# Patient Record
Sex: Female | Born: 1965
Health system: Southern US, Community
[De-identification: ages and names within clinical notes are randomized; demographics above are authoritative.]

## PROBLEM LIST (undated history)

## (undated) DIAGNOSIS — Z8481 Family history of carrier of genetic disease: Secondary | ICD-10-CM

## (undated) DIAGNOSIS — F32A Depression, unspecified: Secondary | ICD-10-CM

## (undated) DIAGNOSIS — Z803 Family history of malignant neoplasm of breast: Secondary | ICD-10-CM

## (undated) DIAGNOSIS — K2210 Ulcer of esophagus without bleeding: Secondary | ICD-10-CM

## (undated) DIAGNOSIS — N390 Urinary tract infection, site not specified: Secondary | ICD-10-CM

## (undated) DIAGNOSIS — F329 Major depressive disorder, single episode, unspecified: Secondary | ICD-10-CM

## (undated) DIAGNOSIS — F419 Anxiety disorder, unspecified: Secondary | ICD-10-CM

## (undated) DIAGNOSIS — K219 Gastro-esophageal reflux disease without esophagitis: Secondary | ICD-10-CM

## (undated) DIAGNOSIS — J302 Other seasonal allergic rhinitis: Secondary | ICD-10-CM

## (undated) DIAGNOSIS — T7840XA Allergy, unspecified, initial encounter: Secondary | ICD-10-CM

## (undated) HISTORY — DX: Gastro-esophageal reflux disease without esophagitis: K21.9

## (undated) HISTORY — DX: Allergy, unspecified, initial encounter: T78.40XA

## (undated) HISTORY — DX: Family history of carrier of genetic disease: Z84.81

## (undated) HISTORY — DX: Depression, unspecified: F32.A

## (undated) HISTORY — PX: APPENDECTOMY: SHX54

## (undated) HISTORY — DX: Anxiety disorder, unspecified: F41.9

## (undated) HISTORY — PX: ESOPHAGOGASTRODUODENOSCOPY: SHX1529

## (undated) HISTORY — DX: Family history of malignant neoplasm of breast: Z80.3

---
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
## 2010-05-06 ENCOUNTER — Ambulatory Visit (INDEPENDENT_AMBULATORY_CARE_PROVIDER_SITE_OTHER): Payer: Self-pay | Admitting: Internal Medicine

## 2014-04-06 ENCOUNTER — Encounter: Payer: Self-pay | Admitting: *Deleted

## 2014-05-02 ENCOUNTER — Encounter: Payer: Self-pay | Admitting: Family Medicine

## 2014-05-02 ENCOUNTER — Ambulatory Visit (INDEPENDENT_AMBULATORY_CARE_PROVIDER_SITE_OTHER): Payer: 59 | Admitting: Family Medicine

## 2014-05-02 VITALS — BP 110/68 | HR 72 | Temp 98.6°F | Resp 16 | Ht 60.5 in | Wt 138.0 lb

## 2014-05-02 DIAGNOSIS — Z833 Family history of diabetes mellitus: Secondary | ICD-10-CM

## 2014-05-02 DIAGNOSIS — F988 Other specified behavioral and emotional disorders with onset usually occurring in childhood and adolescence: Secondary | ICD-10-CM | POA: Insufficient documentation

## 2014-05-02 DIAGNOSIS — K219 Gastro-esophageal reflux disease without esophagitis: Secondary | ICD-10-CM | POA: Diagnosis not present

## 2014-05-02 DIAGNOSIS — Z0000 Encounter for general adult medical examination without abnormal findings: Secondary | ICD-10-CM

## 2014-05-02 DIAGNOSIS — Z1211 Encounter for screening for malignant neoplasm of colon: Secondary | ICD-10-CM | POA: Diagnosis not present

## 2014-05-02 DIAGNOSIS — F411 Generalized anxiety disorder: Secondary | ICD-10-CM | POA: Diagnosis not present

## 2014-05-02 DIAGNOSIS — F909 Attention-deficit hyperactivity disorder, unspecified type: Secondary | ICD-10-CM

## 2014-05-02 DIAGNOSIS — J302 Other seasonal allergic rhinitis: Secondary | ICD-10-CM | POA: Diagnosis not present

## 2014-05-02 DIAGNOSIS — F418 Other specified anxiety disorders: Secondary | ICD-10-CM | POA: Insufficient documentation

## 2014-05-02 LAB — LIPID PANEL
CHOL/HDL RATIO: 3.3 ratio
CHOLESTEROL: 167 mg/dL (ref 0–200)
HDL: 50 mg/dL (ref >=46)
LDL Cholesterol: 96 mg/dL (ref 0–99)
Triglycerides: 105 mg/dL (ref <150)
VLDL: 21 mg/dL (ref 0–40)

## 2014-05-02 LAB — CBC WITH DIFFERENTIAL/PLATELET
BASOS PCT: 0 % (ref 0–1)
Basophils Absolute: 0 10*3/uL (ref 0.0–0.1)
EOS ABS: 0.1 10*3/uL (ref 0.0–0.7)
Eosinophils Relative: 2 % (ref 0–5)
HCT: 35.2 % — ABNORMAL LOW (ref 36.0–46.0)
Hemoglobin: 11.5 g/dL — ABNORMAL LOW (ref 12.0–15.0)
Lymphocytes Relative: 22 % (ref 12–46)
Lymphs Abs: 1.4 10*3/uL (ref 0.7–4.0)
MCH: 28.3 pg (ref 26.0–34.0)
MCHC: 32.7 g/dL (ref 30.0–36.0)
MCV: 86.5 fL (ref 78.0–100.0)
MPV: 9.7 fL (ref 8.6–12.4)
Monocytes Absolute: 0.6 10*3/uL (ref 0.1–1.0)
Monocytes Relative: 9 % (ref 3–12)
NEUTROS PCT: 67 % (ref 43–77)
Neutro Abs: 4.4 10*3/uL (ref 1.7–7.7)
Platelets: 324 10*3/uL (ref 150–400)
RBC: 4.07 MIL/uL (ref 3.87–5.11)
RDW: 16 % — ABNORMAL HIGH (ref 11.5–15.5)
WBC: 6.5 10*3/uL (ref 4.0–10.5)

## 2014-05-02 LAB — COMPREHENSIVE METABOLIC PANEL
ALK PHOS: 49 U/L (ref 39–117)
ALT: 12 U/L (ref 0–35)
AST: 16 U/L (ref 0–37)
Albumin: 4.4 g/dL (ref 3.5–5.2)
BILIRUBIN TOTAL: 0.7 mg/dL (ref 0.2–1.2)
BUN: 11 mg/dL (ref 6–23)
CO2: 24 mEq/L (ref 19–32)
CREATININE: 0.68 mg/dL (ref 0.50–1.10)
Calcium: 9.2 mg/dL (ref 8.4–10.5)
Chloride: 106 mEq/L (ref 96–112)
Glucose, Bld: 86 mg/dL (ref 70–99)
Potassium: 4.3 mEq/L (ref 3.5–5.3)
SODIUM: 140 meq/L (ref 135–145)
Total Protein: 6.8 g/dL (ref 6.0–8.3)

## 2014-05-02 LAB — HEMOGLOBIN A1C
Hgb A1c MFr Bld: 5.5 % (ref <5.7)
MEAN PLASMA GLUCOSE: 111 mg/dL (ref <117)

## 2014-05-02 LAB — TSH: TSH: 1.587 u[IU]/mL (ref 0.350–4.500)

## 2014-05-02 MED ORDER — CITALOPRAM HYDROBROMIDE 20 MG PO TABS: 20.0000 mg | ORAL_TABLET | Freq: Every day | ORAL | Status: DC

## 2014-05-02 MED ORDER — METHYLPHENIDATE HCL ER (OSM) 18 MG PO TBCR: 18.0000 mg | EXTENDED_RELEASE_TABLET | Freq: Every day | ORAL | Status: DC

## 2014-05-02 MED ORDER — PANTOPRAZOLE SODIUM 20 MG PO TBEC: 20.0000 mg | DELAYED_RELEASE_TABLET | Freq: Every day | ORAL | Status: DC

## 2014-05-02 MED ORDER — MOMETASONE FUROATE 50 MCG/ACT NA SUSP: 2.0000 | Freq: Every day | NASAL | Status: DC

## 2014-05-02 NOTE — Assessment & Plan Note (Signed)
Continue protonix  

## 2014-05-02 NOTE — Patient Instructions (Addendum)
Release of records- [facility]- Dr. Barrie Dunker [facility] Release of records- Dr. Woody Seller [facility]) GYN: [facility] ( Dr. Julien Girt)          Or [facility] GYN Referral for Colonoscopy- Dr. Laural Golden Try the Concerta once a day in the morning  We will call with lab results F/U 4 weeks

## 2014-05-02 NOTE — Assessment & Plan Note (Addendum)
Trial of Concerta, will start with low dose and titrate up

## 2014-05-02 NOTE — Assessment & Plan Note (Signed)
Continue nasonex

## 2014-05-02 NOTE — Progress Notes (Signed)
Patient ID: Shelley Macias, female   DOB: May 12, 1965, 49 y.o.   MRN: [id number]   Subjective:    Patient ID: Shelley Macias, female    DOB: 1965-12-17, 49 y.o.   MRN: [id number]  Patient presents for New Patient CPE- no PAP  patient to establish care. Her previous PCP was Dr. Woody Seller , [facility] internal medicine  Previous GYN was [facility] Dr. Barrie Dunker Patient has history of generalized anxiety she has been on Celexa for some time and does well to medication. 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 She is a family history of ADD and ADHD. She's had symptoms of ADD and inattentiveness that had never wanted to go on medication until now. She finds that it takes her extra hours to do her charting and often she'll not leave work until about 8:00 PM even though she goes in quite early in the morning. She feels scatterbrained and unorganized. She cannot focus on one thing at a time.  He is due for mammogram she has history of hypodensities in the breast therefore needs 3-D tomography she's also due for colonoscopy she recently had some constipation but that was due to some changes in her diet.   History and medications reviewed due for fasting labs today she has a family history of diabetes mellitus she will like to be checked for this.  She still having regular menstrual cycles she was seen by her GYN about 2 years ago she needs to establish with a new one within her network   Review Of Systems:  GEN- denies fatigue, fever, weight loss,weakness, recent illness HEENT- denies eye drainage, change in vision, nasal discharge, CVS- denies chest pain, palpitations RESP- denies SOB, cough, wheeze ABD- denies N/V, change in stools, abd pain GU- denies dysuria, hematuria, dribbling, incontinence MSK- denies joint pain, muscle aches, injury Neuro- denies headache, dizziness, syncope, seizure  activity       Objective:    BP 110/68 mmHg  Pulse 72  Temp(Src) 98.6 F (37 C) (Oral)  Resp 16  Ht 5' 0.5" (1.537 m)  Wt 138 lb (62.596 kg)  BMI 26.50 kg/m2  LMP 04/25/2014 (Approximate) GEN- NAD, alert and oriented x3 HEENT- PERRL, EOMI, non injected sclera, pink conjunctiva, MMM, oropharynx clear, TM clear bilat Neck- Supple, no thyromegaly CVS- RRR, no murmur RESP-CTAB ABD-NABS,soft,NT,ND Psych- normal affect and mood EXT- No edema Pulses- Radial, DP- 2+        Assessment & Plan:      Problem List Items Addressed This Visit      Unprioritized   Seasonal allergies    Continue nasonex      GERD (gastroesophageal reflux disease)    Continue protonix      Relevant Medications   pantoprazole (PROTONIX) EC tablet   GAD (generalized anxiety disorder)   ADD (attention deficit disorder)    Continue celexa       Other Visit Diagnoses    Routine general medical examination at a health care facility    -  Primary    Pt to establish witH GYN, Mammogram to be scheduled- needs 3D mammo, colon cancer screening    Relevant Orders    CBC with Differential/Platelet    Comprehensive metabolic panel    Lipid panel    TSH    Vitamin D, 25-hydroxy    Family history of diabetes mellitus  Check A1C    Relevant Orders    Hemoglobin A1c       Note: This dictation was prepared with Dragon dictation along with smaller phrase technology. Any transcriptional errors that result from this process are unintentional.

## 2014-05-03 ENCOUNTER — Other Ambulatory Visit: Payer: Self-pay | Admitting: Family Medicine

## 2014-05-03 DIAGNOSIS — Z1239 Encounter for other screening for malignant neoplasm of breast: Secondary | ICD-10-CM

## 2014-05-03 LAB — VITAMIN D 25 HYDROXY (VIT D DEFICIENCY, FRACTURES): Vit D, 25-Hydroxy: 25 ng/mL — ABNORMAL LOW (ref 30–100)

## 2014-05-04 ENCOUNTER — Encounter (INDEPENDENT_AMBULATORY_CARE_PROVIDER_SITE_OTHER): Payer: Self-pay | Admitting: *Deleted

## 2014-05-25 ENCOUNTER — Telehealth: Payer: Self-pay | Admitting: Family Medicine

## 2014-05-25 NOTE — Telephone Encounter (Signed)
Call placed to patient. LMTRC.  

## 2014-05-25 NOTE — Telephone Encounter (Signed)
Pt called around 1am, was having inceased anxiety, woke up from her sleep with it. She was on day  2 of Concerta, it took some time to get from pharmacy, she was also still on her celexa which is not new. No CP, no SOB, no change in mentation, her husband was awake with her, her speech was normal on the phone and thought process. Advised to stop the concerta though the medication should have worn out of her system by then. If she had any of the above symptoms/red flags to go to ER.   Please call and see how she is doing, I want her to stay off the Concerta and see if there is any further anxiety/panic attacks, if she is still very anxious I would recommend short term ativan 0.5mg  BID prn  And schedule OV

## 2014-05-26 NOTE — Telephone Encounter (Signed)
Call placed to patient. LMTRC.  

## 2014-05-29 ENCOUNTER — Telehealth: Payer: Self-pay | Admitting: *Deleted

## 2014-05-29 NOTE — Telephone Encounter (Signed)
I have tried contacting patient multiple times with mo response and no return call.   Message to be closed.

## 2014-05-29 NOTE — Telephone Encounter (Signed)
Sent patient staff message.

## 2014-05-29 NOTE — Telephone Encounter (Signed)
Alycia Rossetti, MD  [place], LPN     Phone Number: [phone]            Put into chart       Previous Messages     ----- Message -----   From: Eden Lathe Lizzie Cokley, LPN   Sent: 06/28/7320  4:29 PM    To: Alycia Rossetti, MD  Subject: FW: Follow up from Panic/Axiety Attack        ----- Message -----   From: [name], RD   Sent: 05/29/2014  4:18 PM    To: Eden Lathe Allex [place], LPN  Subject: Follow up from Panic/Axiety Attack        HI  I have been fine since I haven't taken it anymore. I only took 2 doses; 1 Tues and 1 Wed. Of last week. I am scheduled to see her tomorrow. It worked fine the first day and I was very focused and productive. The second day I was was ok til the evening and then when I tried to go to bed that is when the anxiety and panic attack happened. I felt ok up til then.   Sorry I didn't get a chance to return your call. Please put in there to call my cell phone [phone] instead of home phone since I dont' always get the messages.   Thanks,  Kieth Brightly  ----- Message -----   From: Eden Lathe Majel Giel, LPN   Sent: 0/03/5425 10:29 AM    To: [name], RD   Hi!   I was just trying to F/U with you since your call to Dr. Buelah Manis.   How has everything been going?

## 2014-05-30 ENCOUNTER — Ambulatory Visit
Admission: RE | Admit: 2014-05-30 | Discharge: 2014-05-30 | Disposition: A | Discharge status: Home / Self Care | Payer: 59 | Source: Ambulatory Visit | Attending: Family Medicine | Admitting: Family Medicine

## 2014-05-30 ENCOUNTER — Encounter: Payer: Self-pay | Admitting: Family Medicine

## 2014-05-30 ENCOUNTER — Ambulatory Visit (INDEPENDENT_AMBULATORY_CARE_PROVIDER_SITE_OTHER): Payer: 59 | Admitting: Family Medicine

## 2014-05-30 VITALS — BP 122/60 | HR 64 | Temp 97.6°F | Resp 12 | Ht 61.0 in | Wt 141.0 lb

## 2014-05-30 DIAGNOSIS — F909 Attention-deficit hyperactivity disorder, unspecified type: Secondary | ICD-10-CM | POA: Diagnosis not present

## 2014-05-30 DIAGNOSIS — F411 Generalized anxiety disorder: Secondary | ICD-10-CM

## 2014-05-30 DIAGNOSIS — Z1239 Encounter for other screening for malignant neoplasm of breast: Secondary | ICD-10-CM

## 2014-05-30 DIAGNOSIS — F988 Other specified behavioral and emotional disorders with onset usually occurring in childhood and adolescence: Secondary | ICD-10-CM

## 2014-05-30 IMAGING — MG MM SCREENING BREAST TOMO BILATERAL
6 series · 6 of 14 positions shown · non-contrast
Comparison: Previous exam(s).

CLINICAL DATA: Screening.

EXAM:
DIGITAL SCREENING BILATERAL MAMMOGRAM WITH 3D TOMO WITH CAD

[R MLO]
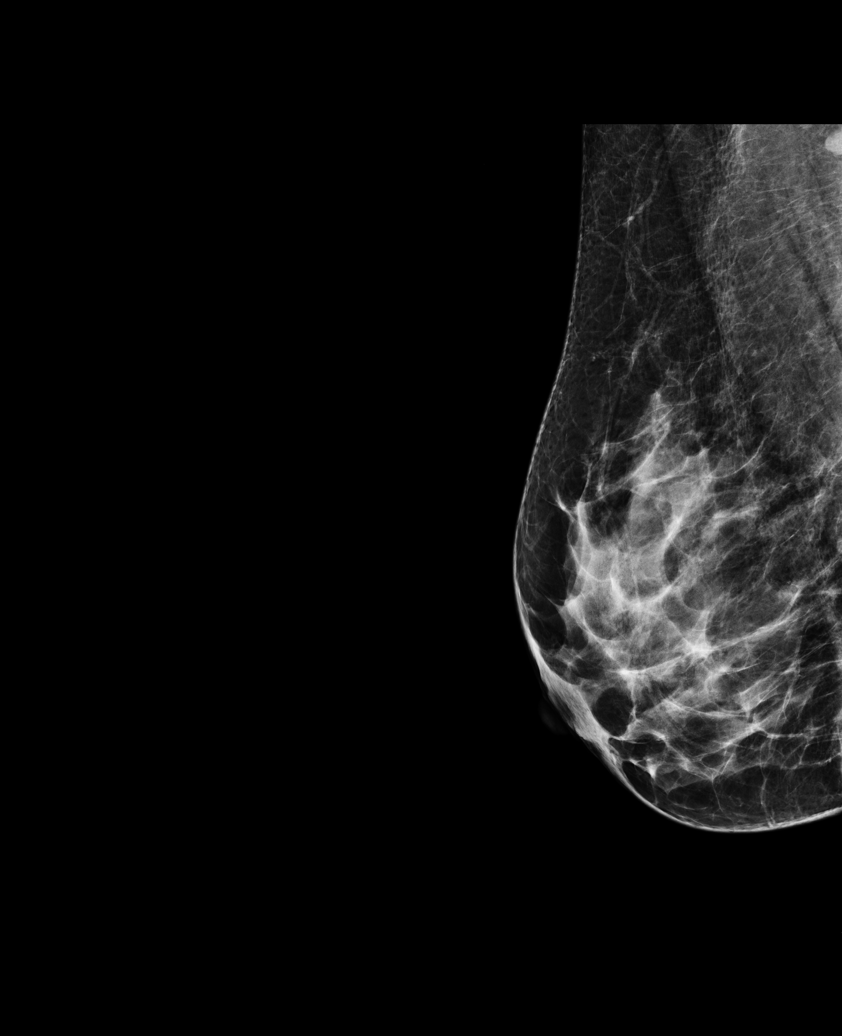

[L CC]
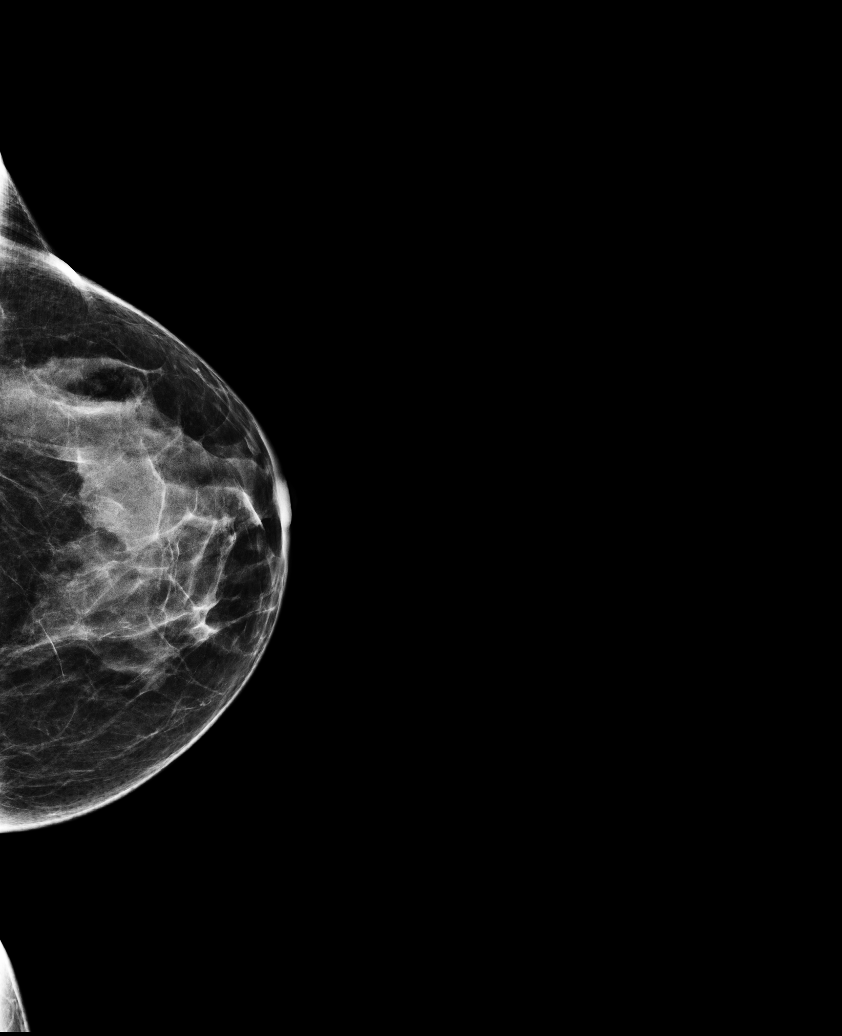

[L MLO]
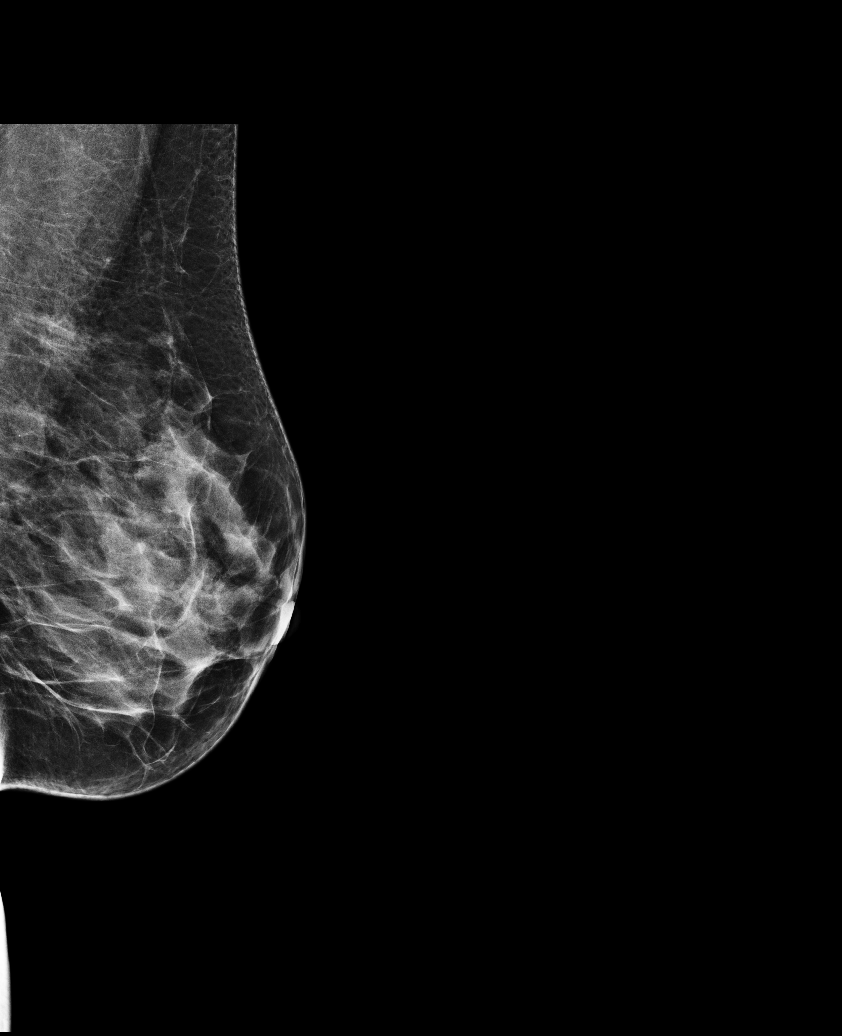

[R CC]
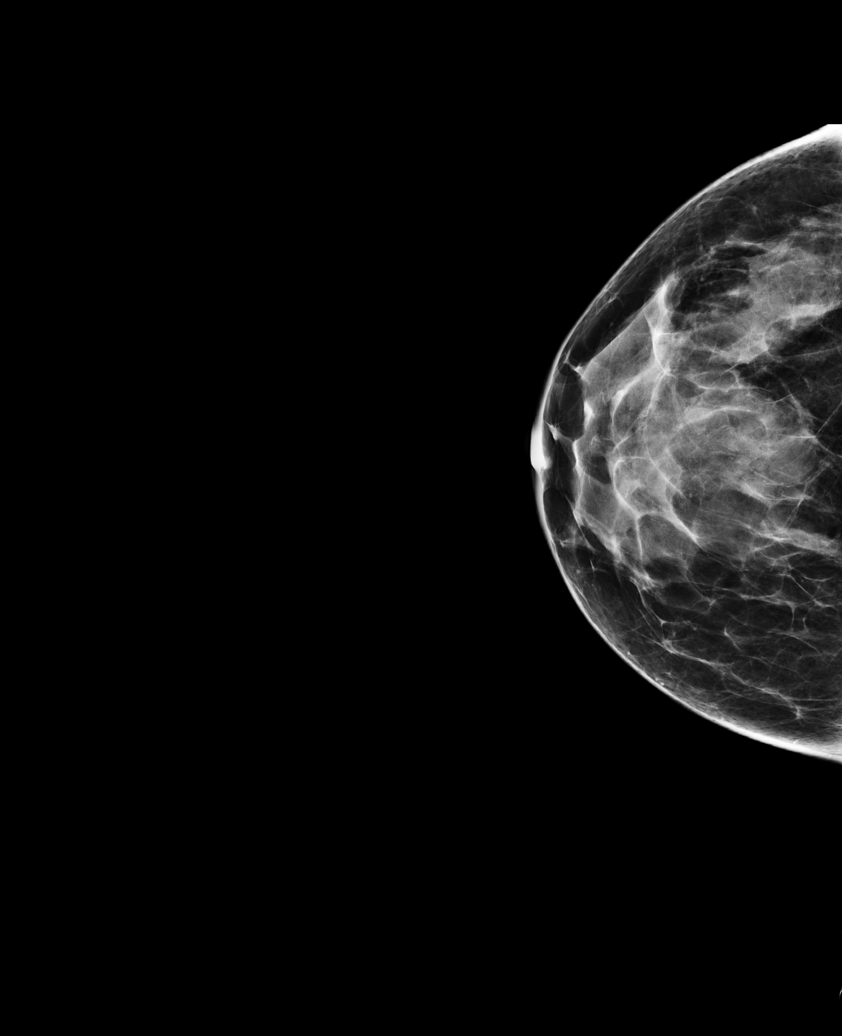

[L CC tomo · tomo slice 35/68.0]
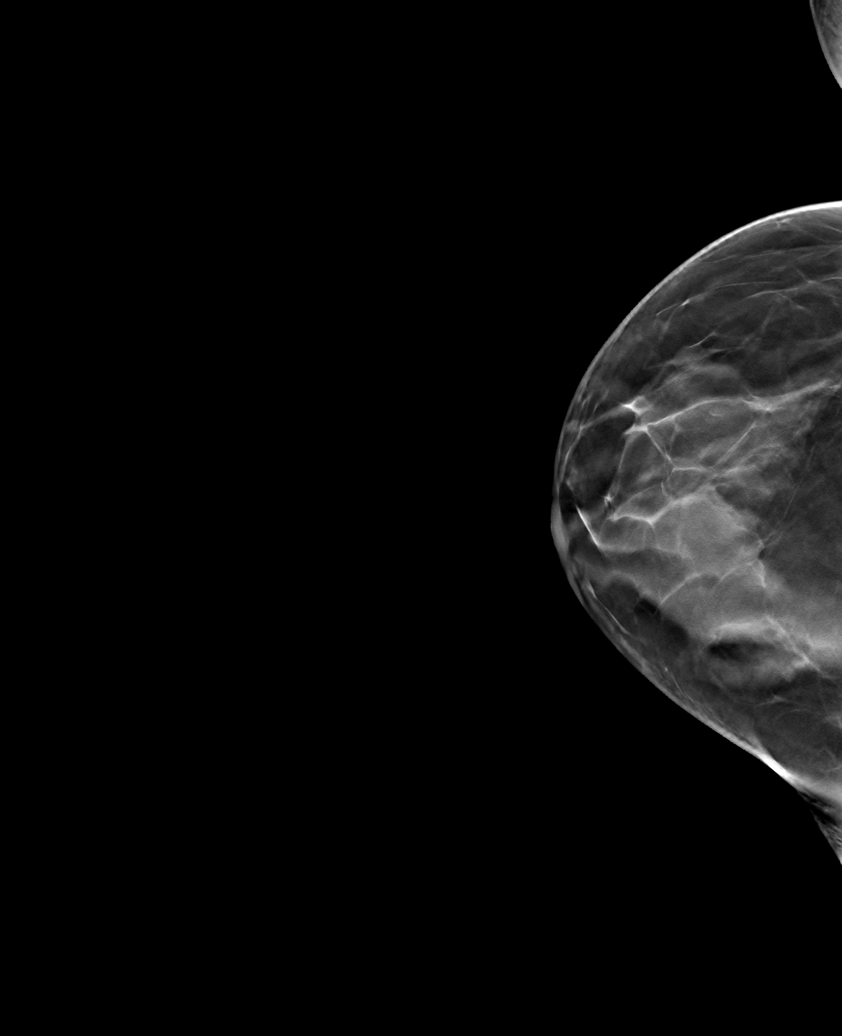

[R CC tomo · tomo slice 31/62.0]
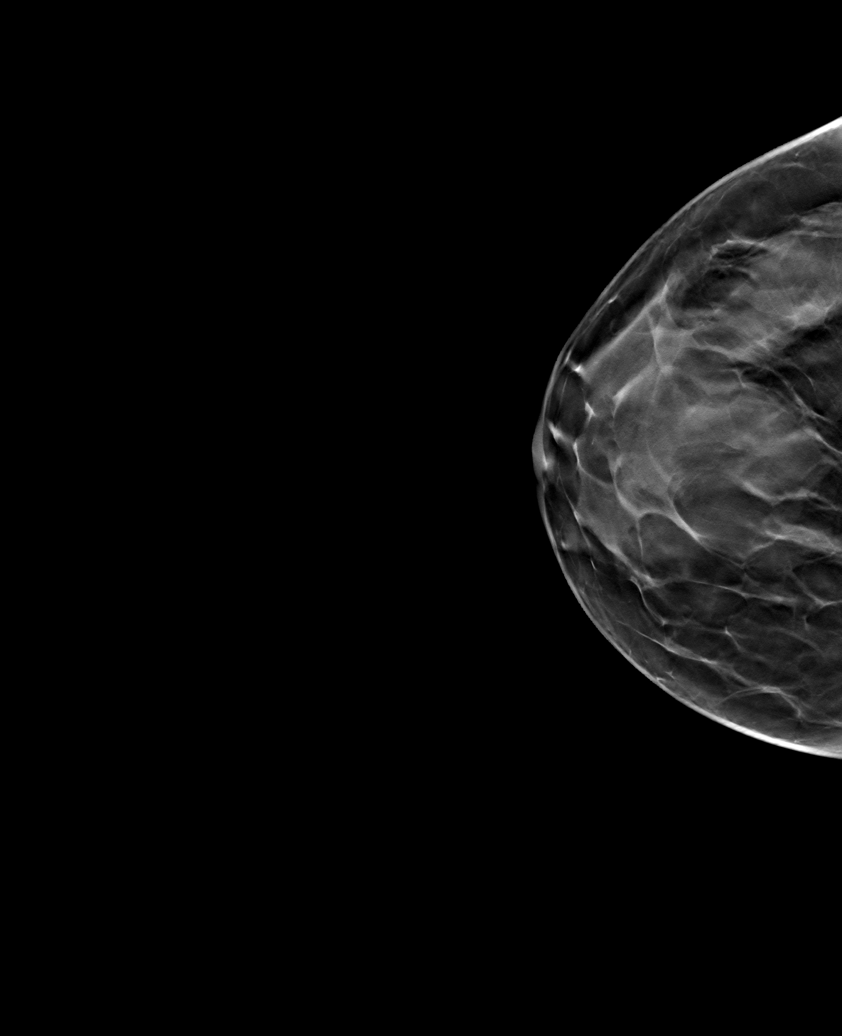

[6 of 14 positions shown; findings below may reference images not displayed]

ACR Breast Density Category c: The breast tissue is heterogeneously
dense, which may obscure small masses.
FINDINGS: There are no findings suspicious for malignancy. Images were
processed with CAD.
IMPRESSION: No mammographic evidence of malignancy. A result letter of this
screening mammogram will be mailed directly to the patient.

RECOMMENDATION:
Screening mammogram in one year. (Code:[SM])

BI-RADS CATEGORY  1: Negative.

## 2014-05-30 NOTE — Progress Notes (Signed)
Patient ID: Shelley Macias, female   DOB: [birth year], 49 y.o.   MRN: [id number]   Subjective:    Patient ID: Shelley Macias, female    DOB: 02/20/1966, 49 y.o.   MRN: [id number]  Patient presents for 4 week F/U  Patient here to follow-up. She actually call the hotline last week after she took her second day of concern that she begin feeling very anxious and had a panic attack was fair. No around 12 1:00 in the morning. She had taken a dose today before and felt a little anxious but she states it was helping her focus so well she was cleaning the house she got so much done that she does not realize the side effects. By day 2 the effects more real to her. I advised her to stop the medication at night when she called me and her panic attack resolved she has not had any issues since then. She still taking her Celexa 20 mg which she does think works well for her. She often gets paranoid when their medical issues coming up or when she is sick family members are sick children IY she has not had the anxiety or the paranoia when she was having the increasing size she began to feel like she could not breathe and it escalated from there which is when she called me.   Review Of Systems:  GEN- denies fatigue, fever, weight loss,weakness, recent illness HEENT- denies eye drainage, change in vision, nasal discharge, CVS- denies chest pain, palpitations RESP- denies SOB, cough, wheeze ABD- denies N/V, change in stools, abd pain GU- denies dysuria, hematuria, dribbling, incontinence MSK- denies joint pain, muscle aches, injury Neuro- denies headache, dizziness, syncope, seizure activity       Objective:    BP 122/60 mmHg  Pulse 64  Temp(Src) 97.6 F (36.4 C) (Oral)  Resp 12  Ht 5\' 1"  (1.549 m)  Wt 141 lb (63.957 kg)  BMI 26.66 kg/m2  LMP 05/23/2014 (Approximate) GEN- NAD, alert and oriented x3 CVS- RRR, no murmur RESP-CTAB Psych- normal affect and mood Pulses- Radial, 2+        Assessment  & Plan:      Problem List Items Addressed This Visit      Unprioritized   GAD (generalized anxiety disorder) - Primary   ADD (attention deficit disorder)      Note: This dictation was prepared with Dragon dictation along with smaller phrase technology. Any transcriptional errors that result from this process are unintentional.

## 2014-05-30 NOTE — Assessment & Plan Note (Signed)
We discussed her anxiety and the panic attack she states that they're very rare typically only when there is a medical issue is someone is ill that she begin to panic. We discussed the use of low-dose benzos during a panic attack but she prefers to hold off. We also discussed increasing her Celexa but she thinks otherwise she's been doing very well to current dose therefore we will hold and just monitor her.

## 2014-05-30 NOTE — Assessment & Plan Note (Signed)
I do not think we should try any other stimulant medication on her because of the underlying anxiety seemed to worsen this. She is also retry the non-stimulated Strattera and it did not seem to work. She states that she is just going to deal with her symptoms that she has done for many years and at this time with how severe of an episode she has a think this is best.

## 2014-05-30 NOTE — Patient Instructions (Signed)
Continue current medications F/U as needed

## 2014-07-11 ENCOUNTER — Encounter: Payer: Self-pay | Admitting: *Deleted

## 2014-07-26 ENCOUNTER — Other Ambulatory Visit (INDEPENDENT_AMBULATORY_CARE_PROVIDER_SITE_OTHER): Payer: Self-pay | Admitting: *Deleted

## 2014-07-26 DIAGNOSIS — Z1211 Encounter for screening for malignant neoplasm of colon: Secondary | ICD-10-CM

## 2014-08-23 ENCOUNTER — Telehealth (INDEPENDENT_AMBULATORY_CARE_PROVIDER_SITE_OTHER): Payer: Self-pay | Admitting: *Deleted

## 2014-08-23 MED ORDER — PEG 3350-KCL-NA BICARB-NACL 420 G PO SOLR: 4000.0000 mL | Freq: Once | ORAL | Status: DC

## 2014-08-23 NOTE — Telephone Encounter (Signed)
Patient needs trilyte 

## 2014-09-01 ENCOUNTER — Other Ambulatory Visit (INDEPENDENT_AMBULATORY_CARE_PROVIDER_SITE_OTHER): Payer: Self-pay | Admitting: *Deleted

## 2014-09-01 DIAGNOSIS — Z1211 Encounter for screening for malignant neoplasm of colon: Secondary | ICD-10-CM

## 2014-09-01 DIAGNOSIS — K30 Functional dyspepsia: Secondary | ICD-10-CM

## 2014-09-01 DIAGNOSIS — K219 Gastro-esophageal reflux disease without esophagitis: Secondary | ICD-10-CM

## 2014-09-25 ENCOUNTER — Telehealth (INDEPENDENT_AMBULATORY_CARE_PROVIDER_SITE_OTHER): Payer: Self-pay | Admitting: *Deleted

## 2014-09-25 NOTE — Telephone Encounter (Signed)
Referring MD/PCP: Guadalupe   Procedure: tcs/egd  Reason/Indication:  Screening, GERD, indigestion  Has patient had this procedure before?  no  If so, when, by whom and where?    Is there a family history of colon cancer?  no  Who?  What age when diagnosed?    Is patient diabetic?   no      Does patient have prosthetic heart valve?  no  Do you have a pacemaker?  no  Has patient ever had endocarditis? no  Has patient had joint replacement within last 12 months?  no  Does patient tend to be constipated or take laxatives? no  Is patient on Coumadin, Plavix and/or Aspirin? no  Medications: see epic  Allergies: see epic  Medication Adjustment:   Procedure date & time: 10/25/14 at 1030

## 2014-09-26 NOTE — Telephone Encounter (Signed)
agree

## 2014-10-23 ENCOUNTER — Encounter: Payer: Self-pay | Admitting: Family Medicine

## 2014-10-24 ENCOUNTER — Encounter: Payer: Self-pay | Admitting: Family Medicine

## 2014-10-24 ENCOUNTER — Ambulatory Visit (INDEPENDENT_AMBULATORY_CARE_PROVIDER_SITE_OTHER): Payer: 59 | Admitting: Family Medicine

## 2014-10-24 VITALS — BP 126/74 | HR 72 | Temp 97.6°F | Resp 12 | Ht 61.0 in | Wt 143.0 lb

## 2014-10-24 DIAGNOSIS — J019 Acute sinusitis, unspecified: Secondary | ICD-10-CM

## 2014-10-24 DIAGNOSIS — N390 Urinary tract infection, site not specified: Secondary | ICD-10-CM | POA: Diagnosis not present

## 2014-10-24 LAB — URINALYSIS, MICROSCOPIC ONLY
CRYSTALS: NONE SEEN [HPF]
Casts: NONE SEEN [LPF]
Yeast: NONE SEEN [HPF]

## 2014-10-24 LAB — URINALYSIS, ROUTINE W REFLEX MICROSCOPIC
Bilirubin Urine: NEGATIVE
GLUCOSE, UA: NEGATIVE
Ketones, ur: NEGATIVE
Nitrite: NEGATIVE
Protein, ur: NEGATIVE
SPECIFIC GRAVITY, URINE: 1.015 (ref 1.001–1.035)
pH: 7 (ref 5.0–8.0)

## 2014-10-24 MED ORDER — CEPHALEXIN 500 MG PO CAPS: 500.0000 mg | ORAL_CAPSULE | Freq: Two times a day (BID) | ORAL | Status: DC

## 2014-10-24 NOTE — Progress Notes (Signed)
Patient ID: Shelley Macias, female   DOB: [birth year], 49 y.o.   MRN: [id number]   Subjective:    Patient ID: Shelley Macias, female    DOB: 04-10-1965, 49 y.o.   MRN: [id number]  Patient presents for Dysuria  patient here with dysuria for the past 3-4 days. She's not used anything over-the-counter. She denies any vaginal discharge no gross hematuria. She does have some suprapubic pressure. She is not having nausea or vomiting associated. She's also had some sinus drainage and pressure she has been using Flonase and a decongestion for the past few days. She is due to have her colonoscopy tomorrow.    Review Of Systems:  GEN- denies fatigue, fever, weight loss,weakness, recent illness HEENT- denies eye drainage, change in vision,+ nasal discharge, CVS- denies chest pain, palpitations RESP- denies SOB, cough, wheeze ABD- denies N/V, change in stools, +abd pain GU- + dysuria, hematuria, dribbling, incontinence MSK- denies joint pain, muscle aches, injury Neuro- denies headache, dizziness, syncope, seizure activity       Objective:    BP 126/74 mmHg  Pulse 72  Temp(Src) 97.6 F (36.4 C) (Oral)  Resp 12  Ht 5\' 1"  (1.549 m)  Wt 143 lb (64.864 kg)  BMI 27.03 kg/m2  LMP  GEN- NAD, alert and oriented x3 HEENT- PERRL, EOMI, non injected sclera, pink conjunctiva, MMM, oropharynx clear  TM clear bilat no effusion,  No  maxillary sinus tenderness, inflammed turbinates,+ clear  Nasal drainage  Neck- Supple, no LAD CVS- RRR, no murmur RESP-CTAB ABD-NABS,soft,NT,ND, No CVA tenderness Pulses- Radial 2+         Assessment & Plan:      Problem List Items Addressed This Visit    None    Visit Diagnoses    UTI (lower urinary tract infection)    -  Primary    Treat with keflex, offered Rocephin injection due to scope tomorrow but she declined    Relevant Medications    cephALEXin (KEFLEX) 500 MG capsule    Other Relevant Orders    Urinalysis, Routine w reflex microscopic (not at  [facility]) (Completed)    Acute rhinosinusitis        Keflex per UTI, continue flonase, decongestant 3-4 days, also use nasal rinse,mucinex    Relevant Medications    cephALEXin (KEFLEX) 500 MG capsule       Note: This dictation was prepared with Dragon dictation along with smaller phrase technology. Any transcriptional errors that result from this process are unintentional.

## 2014-10-24 NOTE — Patient Instructions (Signed)
Take antibiotics as prescribed F/U as needed 

## 2014-10-25 ENCOUNTER — Encounter (HOSPITAL_COMMUNITY)
Admission: RE | Disposition: A | Discharge status: Home / Self Care | Payer: Self-pay | Source: Ambulatory Visit | Attending: Internal Medicine

## 2014-10-25 ENCOUNTER — Encounter (HOSPITAL_COMMUNITY): Payer: Self-pay | Admitting: *Deleted

## 2014-10-25 ENCOUNTER — Ambulatory Visit (HOSPITAL_COMMUNITY)
Admission: RE | Admit: 2014-10-25 | Discharge: 2014-10-25 | Disposition: A | Discharge status: Home / Self Care | Payer: 59 | Source: Ambulatory Visit | Attending: Internal Medicine | Admitting: Internal Medicine

## 2014-10-25 DIAGNOSIS — F411 Generalized anxiety disorder: Secondary | ICD-10-CM | POA: Insufficient documentation

## 2014-10-25 DIAGNOSIS — Z91048 Other nonmedicinal substance allergy status: Secondary | ICD-10-CM | POA: Diagnosis not present

## 2014-10-25 DIAGNOSIS — Z800 Family history of malignant neoplasm of digestive organs: Secondary | ICD-10-CM | POA: Insufficient documentation

## 2014-10-25 DIAGNOSIS — Z885 Allergy status to narcotic agent status: Secondary | ICD-10-CM | POA: Insufficient documentation

## 2014-10-25 DIAGNOSIS — K635 Polyp of colon: Secondary | ICD-10-CM | POA: Diagnosis not present

## 2014-10-25 DIAGNOSIS — K2210 Ulcer of esophagus without bleeding: Secondary | ICD-10-CM

## 2014-10-25 DIAGNOSIS — K5900 Constipation, unspecified: Secondary | ICD-10-CM | POA: Diagnosis not present

## 2014-10-25 DIAGNOSIS — K6289 Other specified diseases of anus and rectum: Secondary | ICD-10-CM | POA: Diagnosis not present

## 2014-10-25 DIAGNOSIS — K219 Gastro-esophageal reflux disease without esophagitis: Secondary | ICD-10-CM | POA: Insufficient documentation

## 2014-10-25 DIAGNOSIS — K3189 Other diseases of stomach and duodenum: Secondary | ICD-10-CM | POA: Diagnosis not present

## 2014-10-25 DIAGNOSIS — R12 Heartburn: Secondary | ICD-10-CM | POA: Diagnosis not present

## 2014-10-25 DIAGNOSIS — Z1211 Encounter for screening for malignant neoplasm of colon: Secondary | ICD-10-CM | POA: Diagnosis not present

## 2014-10-25 DIAGNOSIS — K644 Residual hemorrhoidal skin tags: Secondary | ICD-10-CM | POA: Diagnosis not present

## 2014-10-25 DIAGNOSIS — K648 Other hemorrhoids: Secondary | ICD-10-CM | POA: Diagnosis not present

## 2014-10-25 DIAGNOSIS — Z880 Allergy status to penicillin: Secondary | ICD-10-CM | POA: Diagnosis not present

## 2014-10-25 DIAGNOSIS — K30 Functional dyspepsia: Secondary | ICD-10-CM

## 2014-10-25 DIAGNOSIS — D120 Benign neoplasm of cecum: Secondary | ICD-10-CM | POA: Diagnosis not present

## 2014-10-25 HISTORY — PX: COLONOSCOPY: SHX5424

## 2014-10-25 HISTORY — PX: ESOPHAGOGASTRODUODENOSCOPY: SHX5428

## 2014-10-25 HISTORY — DX: Ulcer of esophagus without bleeding: K22.10

## 2014-10-25 HISTORY — DX: Other seasonal allergic rhinitis: J30.2

## 2014-10-25 HISTORY — DX: Urinary tract infection, site not specified: N39.0

## 2014-10-25 SURGERY — COLONOSCOPY
Anesthesia: Moderate Sedation

## 2014-10-25 MED ORDER — MIDAZOLAM HCL 5 MG/5ML IJ SOLN
INTRAMUSCULAR | Status: DC | PRN
  Administered 2014-10-25 (×4): 2 mg via INTRAVENOUS
  Administered 2014-10-25 (×2): 1 mg via INTRAVENOUS

## 2014-10-25 MED ORDER — PANTOPRAZOLE SODIUM 40 MG PO TBEC: 40.0000 mg | DELAYED_RELEASE_TABLET | Freq: Every day | ORAL | Status: DC

## 2014-10-25 MED ORDER — MEPERIDINE HCL 50 MG/ML IJ SOLN
INTRAMUSCULAR | Status: AC
  Filled 2014-10-25: qty 1

## 2014-10-25 MED ORDER — FENTANYL CITRATE (PF) 100 MCG/2ML IJ SOLN
INTRAMUSCULAR | Status: DC | PRN
  Administered 2014-10-25 (×2): 25 ug via INTRAVENOUS

## 2014-10-25 MED ORDER — BUTAMBEN-TETRACAINE-BENZOCAINE 2-2-14 % EX AERO
INHALATION_SPRAY | CUTANEOUS | Status: DC | PRN
  Administered 2014-10-25: 2 via TOPICAL

## 2014-10-25 MED ORDER — SODIUM CHLORIDE 0.9 % IV SOLN
INTRAVENOUS | Status: DC
  Administered 2014-10-25: 09:00:00 via INTRAVENOUS

## 2014-10-25 MED ORDER — STERILE WATER FOR IRRIGATION IR SOLN
Status: DC | PRN
  Administered 2014-10-25: 11:00:00

## 2014-10-25 MED ORDER — FENTANYL CITRATE (PF) 100 MCG/2ML IJ SOLN
INTRAMUSCULAR | Status: AC
  Filled 2014-10-25: qty 2

## 2014-10-25 MED ORDER — MIDAZOLAM HCL 5 MG/5ML IJ SOLN
INTRAMUSCULAR | Status: DC
Start: 2014-10-25 — End: 2014-10-25
  Filled 2014-10-25: qty 10

## 2014-10-25 NOTE — Discharge Instructions (Signed)
Increase pantoprazole to 40 mg by mouth 30 minutes before breakfast daily. Resume other medications and diet as before. No driving for 24 hours. Physician will call with biopsy results. Colonoscopy, Care After Refer to this sheet in the next few weeks. These instructions provide you with information on caring for yourself after your procedure. Your health care provider may also give you more specific instructions. Your treatment has been planned according to current medical practices, but problems sometimes occur. Call your health care provider if you have any problems or questions after your procedure. WHAT TO EXPECT AFTER THE PROCEDURE  After your procedure, it is typical to have the following:  A small amount of blood in your stool.  Moderate amounts of gas and mild abdominal cramping or bloating. HOME CARE INSTRUCTIONS  Do not drive, operate machinery, or sign important documents for 24 hours.  You may shower and resume your regular physical activities, but move at a slower pace for the first 24 hours.  Take frequent rest periods for the first 24 hours.  Walk around or put a warm pack on your abdomen to help reduce abdominal cramping and bloating.  Drink enough fluids to keep your urine clear or pale yellow.  You may resume your normal diet as instructed by your health care provider. Avoid heavy or fried foods that are hard to digest.  Avoid drinking alcohol for 24 hours or as instructed by your health care provider.  Only take over-the-counter or prescription medicines as directed by your health care provider.  If a tissue sample (biopsy) was taken during your procedure:  Do not take aspirin or blood thinners for 7 days, or as instructed by your health care provider.  Do not drink alcohol for 7 days, or as instructed by your health care provider.  Eat soft foods for the first 24 hours. SEEK MEDICAL CARE IF: You have persistent spotting of blood in your stool 2-3 days after  the procedure. SEEK IMMEDIATE MEDICAL CARE IF:  You have more than a small spotting of blood in your stool.  You pass large blood clots in your stool.  Your abdomen is swollen (distended).  You have nausea or vomiting.  You have a fever.  You have increasing abdominal pain that is not relieved with medicine. Document Released: 09/25/2003 Document Revised: 12/01/2012 Document Reviewed: 10/18/2012 [facility] Patient Information 2015 [place], [place]. This information is not intended to replace advice given to you by your health care provider. Make sure you discuss any questions you have with your health care provider.   Colon Polyps Polyps are lumps of extra tissue growing inside the body. Polyps can grow in the large intestine (colon). Most colon polyps are noncancerous (benign). However, some colon polyps can become cancerous over time. Polyps that are larger than a pea may be harmful. To be safe, caregivers remove and test all polyps. CAUSES  Polyps form when mutations in the genes cause your cells to grow and divide even though no more tissue is needed. RISK FACTORS There are a number of risk factors that can increase your chances of getting colon polyps. They include:  Being older than 50 years.  Family history of colon polyps or colon cancer.  Long-term colon diseases, such as colitis or Crohn disease.  Being overweight.  Smoking.  Being inactive.  Drinking too much alcohol. SYMPTOMS  Most small polyps do not cause symptoms. If symptoms are present, they may include:  Blood in the stool. The stool may look dark red  or black.  Constipation or diarrhea that lasts longer than 1 week. DIAGNOSIS People often do not know they have polyps until their caregiver finds them during a regular checkup. Your caregiver can use 4 tests to check for polyps:  Digital rectal exam. The caregiver wears gloves and feels inside the rectum. This test would find polyps only in the  rectum.  Barium enema. The caregiver puts a liquid called barium into your rectum before taking X-rays of your colon. Barium makes your colon look white. Polyps are dark, so they are easy to see in the X-ray pictures.  Sigmoidoscopy. A thin, flexible tube (sigmoidoscope) is placed into your rectum. The sigmoidoscope has a light and tiny camera in it. The caregiver uses the sigmoidoscope to look at the last third of your colon.  Colonoscopy. This test is like sigmoidoscopy, but the caregiver looks at the entire colon. This is the most common method for finding and removing polyps. TREATMENT  Any polyps will be removed during a sigmoidoscopy or colonoscopy. The polyps are then tested for cancer. PREVENTION  To help lower your risk of getting more colon polyps:  Eat plenty of fruits and vegetables. Avoid eating fatty foods.  Do not smoke.  Avoid drinking alcohol.  Exercise every day.  Lose weight if recommended by your caregiver.  Eat plenty of calcium and folate. Foods that are rich in calcium include milk, cheese, and broccoli. Foods that are rich in folate include chickpeas, kidney beans, and spinach. HOME CARE INSTRUCTIONS Keep all follow-up appointments as directed by your caregiver. You may need periodic exams to check for polyps. SEEK MEDICAL CARE IF: You notice bleeding during a bowel movement. Document Released: 11/07/2003 Document Revised: 05/05/2011 Document Reviewed: 04/22/2011 [facility] Patient Information 2015 [place], [place]. This information is not intended to replace advice given to you by your health care provider. Make sure you discuss any questions you have with your health care provider.

## 2014-10-25 NOTE — H&P (Signed)
Shelley Macias is an 49 y.o. female.   Chief Complaint: Patient's here for EGD and colonoscopy. HPI: Patient is 49 year old Caucasian female who has had symptoms of heartburn for over 10 years and is presently on double dose PPI. She has intermittent heartburn certain foods. She denies dysphagia. She also denies hoarseness or chronic cough or sore throat. She had EGD EGD about 8 years ago and was found of esophageal ulcer. She was also treated for H. pylori infection. She is also undergoing average risk screening colonoscopy. She denies rectal bleeding abdominal pain. She has occasional constipation. Family history is negative for CRC. Her sister died in her mid 14s of cholangiocarcinoma.  Past Medical History  Diagnosis Date  . Allergy   . Anxiety   . GERD (gastroesophageal reflux disease)   . Seasonal allergies   . Urinary tract infection   . Esophageal ulcer     Past Surgical History  Procedure Laterality Date  . Appendectomy    . Cesarean section  1999, 1996  . Esophagogastroduodenoscopy      Family History  Problem Relation Age of Onset  . Hearing loss Mother   . Heart disease Father   . Hyperlipidemia Father   . Hypertension Father   . Diabetes Father   . Mental illness Sister   . Learning disabilities Sister   . Cancer Sister     BILE DUCT   Social History:  reports that she has never smoked. She has never used smokeless tobacco. She reports that she does not drink alcohol or use illicit drugs.  Allergies:  Allergies  Allergen Reactions  . Demerol [Meperidine]   . Other     CATS  . Penicillins     Medications Prior to Admission  Medication Sig Dispense Refill  . cephALEXin (KEFLEX) 500 MG capsule Take 1 capsule (500 mg total) by mouth 2 (two) times daily. 10 capsule 0  . citalopram (CELEXA) 20 MG tablet Take 1 tablet (20 mg total) by mouth daily. 30 tablet 6  . fluticasone (FLONASE) 50 MCG/ACT nasal spray Place 2 sprays into both nostrils daily.    .  pantoprazole (PROTONIX) 20 MG tablet Take 1 tablet (20 mg total) by mouth daily. 30 tablet 3  . polyethylene glycol-electrolytes (NULYTELY/GOLYTELY) 420 G solution Take 4,000 mLs by mouth once. 4000 mL 0    Results for orders placed or performed in visit on 10/24/14 (from the past 48 hour(s))  Urinalysis, Routine w reflex microscopic (not at [facility])     Status: Abnormal   Collection Time: 10/24/14  9:25 AM  Result Value Ref Range   Color, Urine YELLOW YELLOW    Comment: ** Please note change in unit of measure and reference range(s). **      APPearance CLEAR CLEAR   Specific Gravity, Urine 1.015 1.001 - 1.035   pH 7.0 5.0 - 8.0   Glucose, UA NEGATIVE NEGATIVE   Bilirubin Urine NEGATIVE NEGATIVE   Ketones, ur NEGATIVE NEGATIVE   Hgb urine dipstick TRACE (A) NEGATIVE   Protein, ur NEGATIVE NEGATIVE   Nitrite NEGATIVE NEGATIVE   Leukocytes, UA 1+ (A) NEGATIVE  Urine Microscopic     Status: Abnormal   Collection Time: 10/24/14  9:25 AM  Result Value Ref Range   WBC, UA 0-5 <=5 WBC/HPF   RBC / HPF 0-2 <=2 RBC/HPF   Squamous Epithelial / LPF 0-5 <=5 HPF    Comment: Renal Epithelial Cells        1-3                    <=  3           HPF   Bacteria, UA FEW (A) NONE SEEN HPF   Crystals NONE SEEN NONE SEEN HPF   Casts NONE SEEN NONE SEEN LPF   Yeast NONE SEEN NONE SEEN HPF   Urine-Other OVAL FAT BODIES SEEN IN RTE CELLS    No results found.  ROS  Blood pressure 125/73, pulse 86, temperature 98.2 F (36.8 C), temperature source Oral, resp. rate 18, height 5' (1.524 m), weight 140 lb (63.504 kg), SpO2 100 %. Physical Exam  Constitutional: She appears well-developed and well-nourished.  HENT:  Mouth/Throat: Oropharynx is clear and moist.  Eyes: Conjunctivae are normal. No scleral icterus.  Neck: No thyromegaly present.  Cardiovascular: Normal rate, regular rhythm and normal heart sounds.   No murmur heard. Respiratory: Effort normal and breath sounds normal.  GI: Soft. She exhibits  no distension and no mass. There is no tenderness.  Musculoskeletal: She exhibits no edema.  Lymphadenopathy:    She has no cervical adenopathy.  Neurological: She is alert.  Skin: Skin is warm and dry.     Assessment/Plan Chronic GERD. Diagnostic EGD and average risk screening colonoscopy.  REHMAN,NAJEEB U 10/25/2014, 11:21 AM

## 2014-10-25 NOTE — Op Note (Signed)
EGD AND COLONOSCOPY PROCEDURE REPORT  PATIENT:  Shelley Macias  MR#:  [id number] Birthdate:  1965/11/27, 49 y.o., female Endoscopist:  Dr. Rogene Houston, MD Referred By:  Dr. Vic Blackbird, MD  Procedure Date: 10/25/2014  Procedure:   EGD & Colonoscopy  Indications:  Patient is 49 year old Caucasian female who is had symptoms of GERD for more than 10 years. Symptom control has not been satisfactory with therapy. Last EGD was possibly 8 years ago revealing erosive esophagitis and gastritis and she was treated for H. pylori infection. She is also undergoing average risk screening colonoscopy.            Informed Consent:  The risks, benefits, alternatives & imponderables which include, but are not limited to, bleeding, infection, perforation, drug reaction and potential missed lesion have been reviewed.  The potential for biopsy, lesion removal, esophageal dilation, etc. have also been discussed.  Questions have been answered.  All parties agreeable.  Please see history & physical in medical record for more information.  Medications:  Demerol 50 mg IV Versed 10 mg IV Cetacaine spray topically for oropharyngeal anesthesia  EGD  Description of procedure:  The endoscope was introduced through the mouth and advanced to the second portion of the duodenum without difficulty or limitations. The mucosal surfaces were surveyed very carefully during advancement of the scope and upon withdrawal.  Findings:  Esophagus:  Mucosa of the esophagus was normal to small linear erosion noted at distal esophagus extending into GE junction. GE junction was serrated or wavy. No ring or stricture noted. GEJ:  35 cm Stomach: Stomach was empty and distended very well with insufflation. Folds in the proximal stomach were normal. Examination of mucosa gastric body, antrum, pyloric channel, and rest fundus and cardia was normal. Duodenum:  Bulbar and post bulbar mucosa.  Therapeutic/Diagnostic Maneuvers Performed:    Biopsies taken from GE junction to rule out short segment Barrett's esophagus.  COLONOSCOPY Description of procedure:  After a digital rectal exam was performed, that colonoscope was advanced from the anus through the rectum and colon to the area of the cecum, ileocecal valve and appendiceal orifice. The cecum was deeply intubated. These structures were well-seen and photographed for the record. From the level of the cecum and ileocecal valve, the scope was slowly and cautiously withdrawn. The mucosal surfaces were carefully surveyed utilizing scope tip to flexion to facilitate fold flattening as needed. The scope was pulled down into the rectum where a thorough exam including retroflexion was performed. Terminal ileum was also examined.  Findings:   Prep excellent. Normal mucosa of terminal ileum. 3 mm cecal polyp ablated via cold biopsy. Mucosa of rest of the colon and rectum was normal. Small hemorrhoids below the dentate line along with anal papilla.   Therapeutic/Diagnostic Maneuvers Performed:  See above  Complications:  None  Cecal Withdrawal Time:  10 minutes  Impression:  EGD findings: Single erosion at distal esophagus extending into GE junction. Wavy or serrated GE junction. Biopsy taken to rule out shows segment Barrett's. Normal examination stomach first and second part of duodenum.  Colonoscopy findings: Normal mucosa of terminal ileum. 3 mm cecal polyp ablated via cold biopsy. Small external hemorrhoids and single anal papilla.  Recommendations:  Standard instructions given. Increase pantoprazole to 40 mg by mouth every morning. I will be contacting patient with biopsy results and further recommendations.  [facility]  10/25/2014 12:20 PM  CC: Dr. Vic Blackbird, MD & Dr. Rayne Du ref. provider found

## 2014-10-26 ENCOUNTER — Encounter (HOSPITAL_COMMUNITY): Payer: Self-pay | Admitting: Internal Medicine

## 2014-11-08 ENCOUNTER — Telehealth: Payer: Self-pay | Admitting: General Practice

## 2014-11-08 NOTE — Telephone Encounter (Signed)
Patient called to speak with CM and I told her CM was on another call and I could transfer to her VM, but patient said, No. She would call back later.

## 2015-02-12 ENCOUNTER — Encounter: Payer: Self-pay | Admitting: Family Medicine

## 2015-02-14 ENCOUNTER — Encounter: Payer: Self-pay | Admitting: Family Medicine

## 2015-02-14 MED ORDER — PANTOPRAZOLE SODIUM 40 MG PO TBEC: 40.0000 mg | DELAYED_RELEASE_TABLET | Freq: Every day | ORAL | Status: DC

## 2015-02-14 MED ORDER — CITALOPRAM HYDROBROMIDE 20 MG PO TABS: 20.0000 mg | ORAL_TABLET | Freq: Every day | ORAL | Status: DC

## 2015-04-05 ENCOUNTER — Encounter: Payer: Self-pay | Admitting: Family Medicine

## 2015-05-07 ENCOUNTER — Encounter: Payer: Self-pay | Admitting: Family Medicine

## 2015-07-19 ENCOUNTER — Other Ambulatory Visit: Payer: Self-pay

## 2015-07-19 ENCOUNTER — Other Ambulatory Visit: Payer: Self-pay | Admitting: Family Medicine

## 2015-07-19 DIAGNOSIS — Z1231 Encounter for screening mammogram for malignant neoplasm of breast: Secondary | ICD-10-CM

## 2015-07-31 DIAGNOSIS — F1920 Other psychoactive substance dependence, uncomplicated: Secondary | ICD-10-CM | POA: Diagnosis not present

## 2015-07-31 DIAGNOSIS — F338 Other recurrent depressive disorders: Secondary | ICD-10-CM | POA: Diagnosis not present

## 2015-07-31 DIAGNOSIS — Z79899 Other long term (current) drug therapy: Secondary | ICD-10-CM | POA: Diagnosis not present

## 2015-07-31 DIAGNOSIS — R41840 Attention and concentration deficit: Secondary | ICD-10-CM | POA: Diagnosis not present

## 2015-07-31 DIAGNOSIS — F419 Anxiety disorder, unspecified: Secondary | ICD-10-CM | POA: Diagnosis not present

## 2015-07-31 DIAGNOSIS — F902 Attention-deficit hyperactivity disorder, combined type: Secondary | ICD-10-CM | POA: Diagnosis not present

## 2015-07-31 DIAGNOSIS — K219 Gastro-esophageal reflux disease without esophagitis: Secondary | ICD-10-CM | POA: Diagnosis not present

## 2015-07-31 DIAGNOSIS — F4010 Social phobia, unspecified: Secondary | ICD-10-CM | POA: Diagnosis not present

## 2015-07-31 DIAGNOSIS — F429 Obsessive-compulsive disorder, unspecified: Secondary | ICD-10-CM | POA: Diagnosis not present

## 2015-07-31 DIAGNOSIS — H93299 Other abnormal auditory perceptions, unspecified ear: Secondary | ICD-10-CM | POA: Diagnosis not present

## 2015-08-02 ENCOUNTER — Ambulatory Visit (HOSPITAL_COMMUNITY)
Admission: RE | Admit: 2015-08-02 | Discharge: 2015-08-02 | Disposition: A | Discharge status: Home / Self Care | Payer: 59 | Source: Ambulatory Visit | Attending: Family Medicine | Admitting: Family Medicine

## 2015-08-02 DIAGNOSIS — Z1231 Encounter for screening mammogram for malignant neoplasm of breast: Secondary | ICD-10-CM | POA: Insufficient documentation

## 2015-08-02 IMAGING — MG 2D DIGITAL SCREENING BILATERAL MAMMOGRAM WITH CAD AND ADJUNCT TO
8 series · 9 of 24 positions shown · non-contrast
Comparison: Previous exam(s).

CLINICAL DATA: Screening.

EXAM:
2D DIGITAL SCREENING BILATERAL MAMMOGRAM WITH CAD AND ADJUNCT TOMO

[R CC]
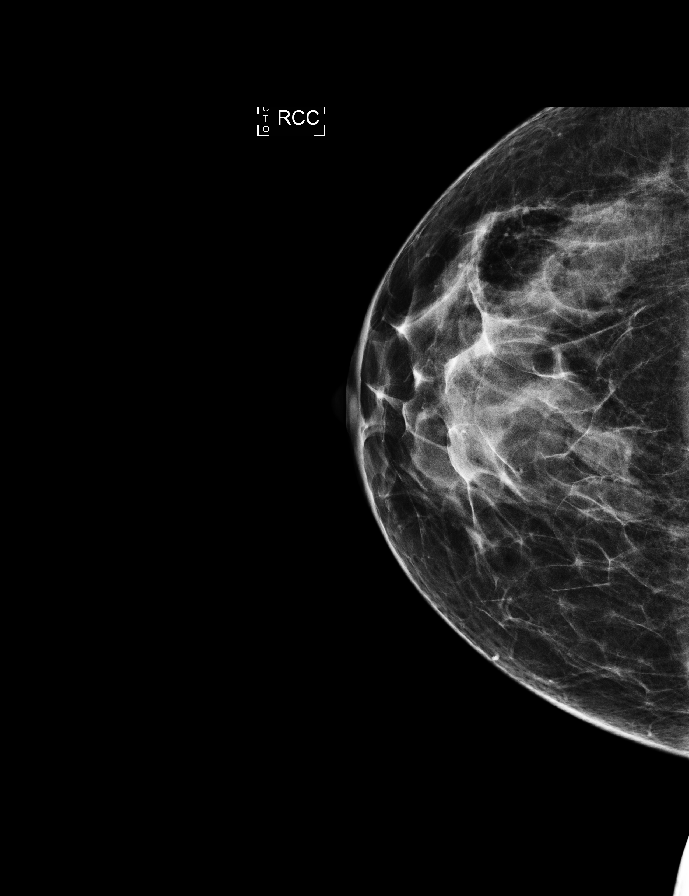

[L MLO]
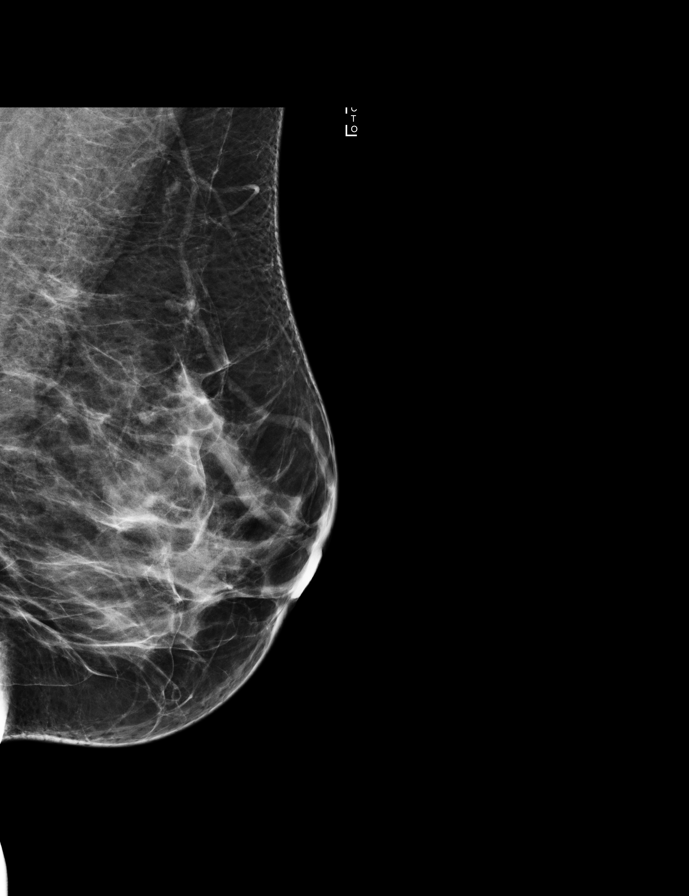

[L CC]
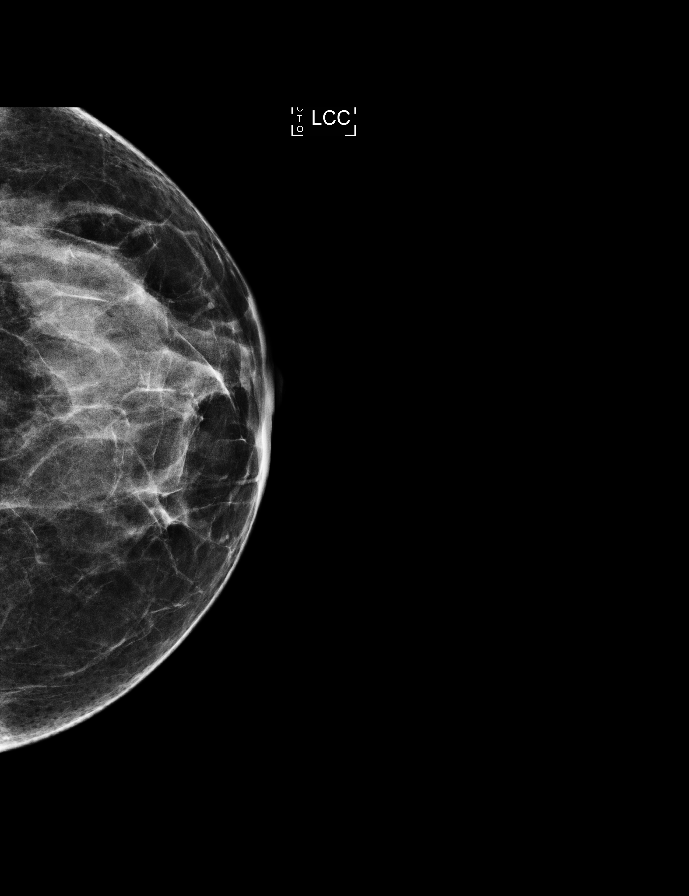

[R MLO]
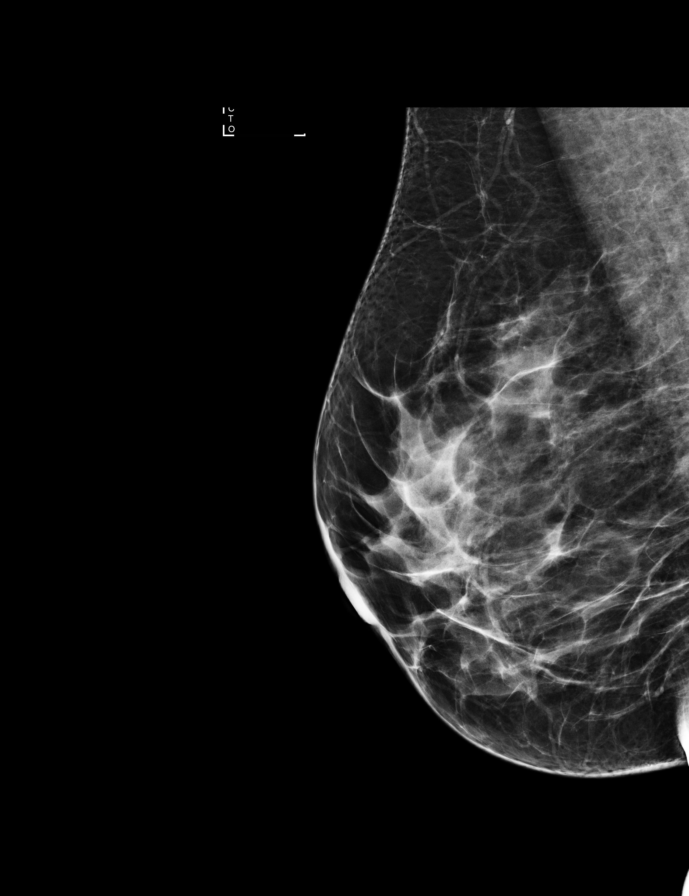

[L MLO tomo · 2 of 69 frames shown]
[frame 23/69]
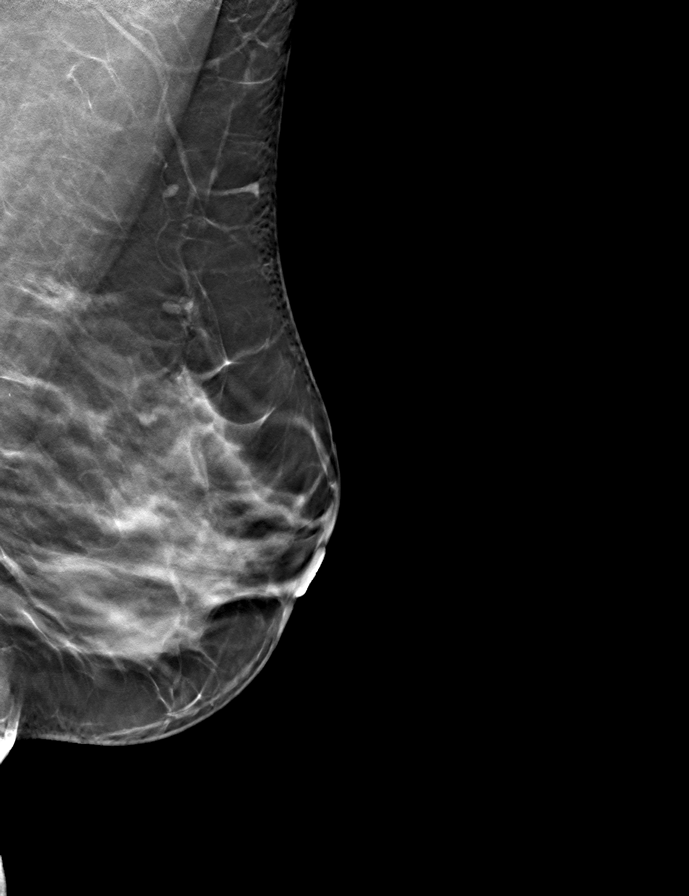
[frame 35/69]
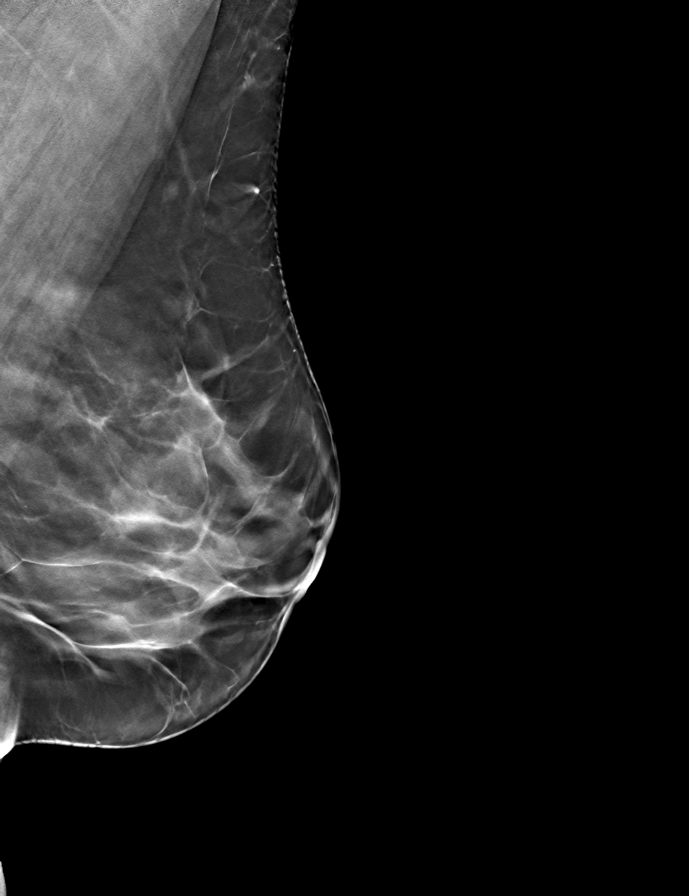

[R MLO tomo · tomo slice 33/65.0]
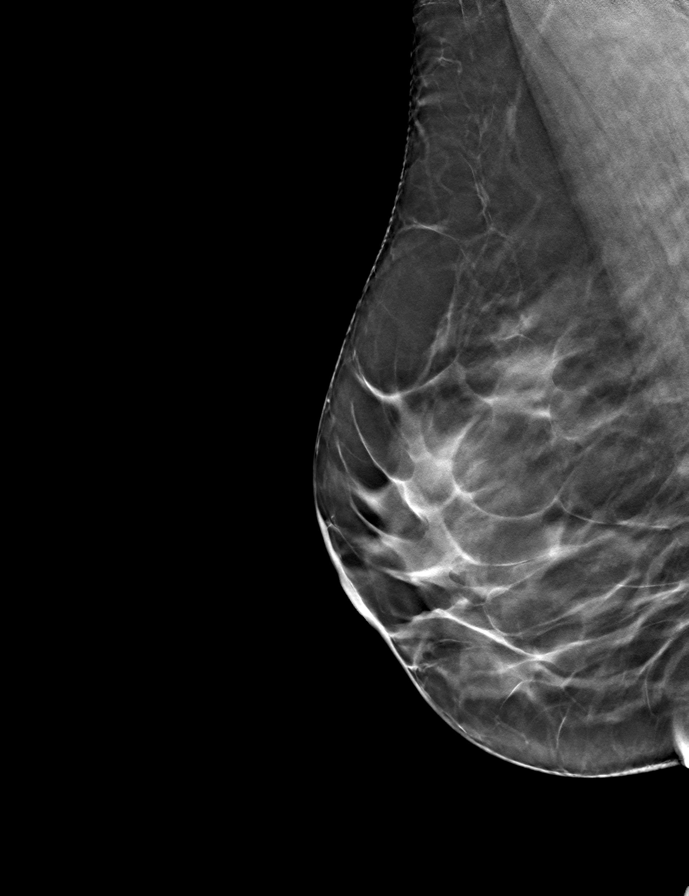

[R CC tomo · tomo slice 35/69.0]
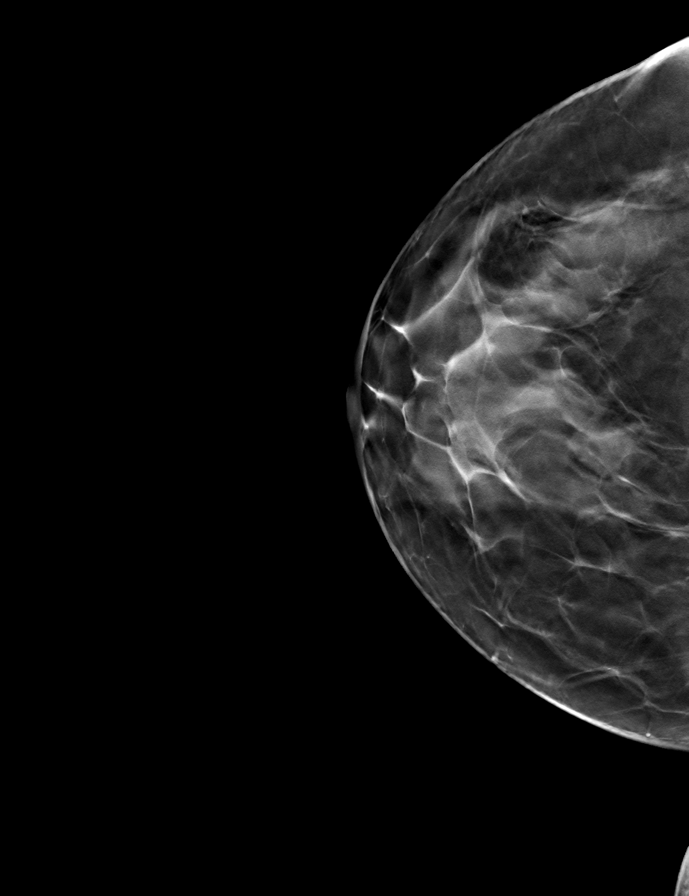

[L CC tomo · tomo slice 30/59.0]
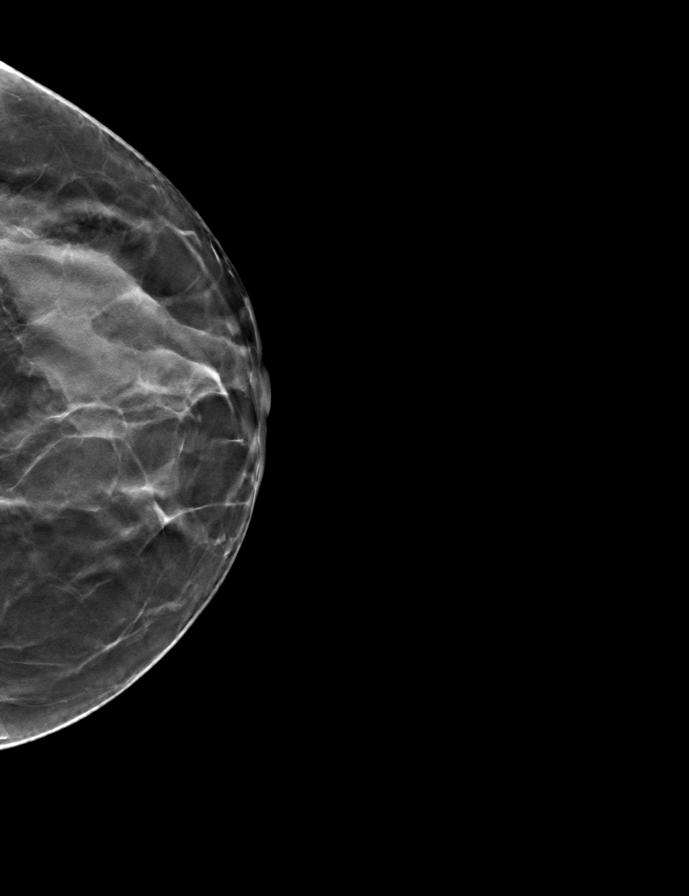

[9 of 24 positions shown; findings below may reference images not displayed]

ACR Breast Density Category c: The breast tissue is heterogeneously
dense, which may obscure small masses.
FINDINGS: There are no findings suspicious for malignancy. Images were
processed with CAD.
IMPRESSION: No mammographic evidence of malignancy. A result letter of this
screening mammogram will be mailed directly to the patient.

RECOMMENDATION:
Screening mammogram in one year. (Code:[TA])

BI-RADS CATEGORY  1: Negative.

## 2015-08-08 ENCOUNTER — Telehealth: Payer: Self-pay | Admitting: Family Medicine

## 2015-08-08 MED ORDER — CITALOPRAM HYDROBROMIDE 20 MG PO TABS: 20.0000 mg | ORAL_TABLET | Freq: Every day | ORAL | Status: DC

## 2015-08-08 MED FILL — CITALOPRAM HBR 20 MG TABLET: 20 | 90 days supply | Qty: 90 | Fill #0

## 2015-08-08 NOTE — Telephone Encounter (Signed)
Pt is requesting a refill of citalopram 20 mg. [facility]

## 2015-08-08 NOTE — Telephone Encounter (Signed)
Prescription sent to [facility] to be sent to [place]

## 2015-12-13 ENCOUNTER — Ambulatory Visit: Payer: 59 | Attending: Internal Medicine | Admitting: Audiology

## 2015-12-13 DIAGNOSIS — H9325 Central auditory processing disorder: Secondary | ICD-10-CM | POA: Insufficient documentation

## 2015-12-13 DIAGNOSIS — R94120 Abnormal auditory function study: Secondary | ICD-10-CM | POA: Insufficient documentation

## 2015-12-13 DIAGNOSIS — H93293 Other abnormal auditory perceptions, bilateral: Secondary | ICD-10-CM | POA: Insufficient documentation

## 2015-12-13 DIAGNOSIS — H93299 Other abnormal auditory perceptions, unspecified ear: Secondary | ICD-10-CM | POA: Insufficient documentation

## 2015-12-13 DIAGNOSIS — H833X3 Noise effects on inner ear, bilateral: Secondary | ICD-10-CM | POA: Insufficient documentation

## 2015-12-13 NOTE — Procedures (Signed)
[facility], [place]  [id number] [phone]  AUDIOLOGICAL AND AUDITORY PROCESSING EVALUATION  NAME: Shelley Macias  STATUS: Outpatient DOB:   Aug 04, 1965   DIAGNOSIS: Evaluate for Central auditory                                                                                    processing disorder   MRN: [id number]                                                                                      DATE: 12/13/2015   REFERENT: Dr. Rachel Moulds, [place] Attention Specialist  HISTORY: Shelley Macias was seen for an audiological and central auditory processing evaluation.  Primary Concern: Staying focused and on task.  Easily distracted by background noise, competing messages and the loudness of sound.  At work uses background music or environmental sounds to "level out the background sound". Has a history of reading and comprehension difficulty that Shelley Macias thinks has been related to attention. Sound sensitivity? N  Previous diagnosis: "ADD" likely, but "unable to take medication due to adverse reactions".   Family history of hearing loss:  Son has 30% hearing loss, from ear infections as a young child, but nothing genetic.   EVALUATION: Pure tone air conduction testing showed 5-20 dBHL hearing thresholds on the right and 10-20 dBHL on the left except for a 25 dBHL hearing threshold at 2000Hz  only using inserts.  Speech reception thresholds are 15 dBHL on the left and 10 dBHL on the right using recorded spondee word lists. Word recognition was 92% at 55 dBHL at on the left at and 100% at 50 dBHL on the right using recorded NU-6 word lists, in quiet.  Otoscopic inspection reveals clear ear canals with visible tympanic membranes.  Tympanometry showed normal middle ear volume and pressure bilaterally with normal compliance on the right (Type A) and slightly shallow compliance on the left. Ipsilateral acoustic reflexes are  present and within normal limits from 500Hz  - 4000Hz  bilaterally.  Distortion Product Otoacoustic Emissions (DPOAE) testing showed present - borderline, weak or abnormal responses bilaterally.   A summary of [name]'s central auditory processing evaluation is as follows: Uncomfortable Loudness Testing was performed using speech noise.  Shelley Macias reported that  Volume of 50-55 "annoyed" and "hurt a little and was very annoying" at 65/70 dBHL and "hurt a lot" at 75 dBHL when presented binaurally.  By history that is supported by testing, Shelley Macias has sound sensitivity which much be closely monitored to determine whether it is associated with the damage to the cochlea (early sensorineural hearing loss) and/or auditory processing disorder.    Speech-in-Noise testing was performed to determine speech discrimination in the presence of background noise.  Shelley Macias scored 70% in the right ear ((abnormal) and 80% (borderline) in  the left ear, when noise was presented 5 dB below speech. Shelley Macias is expected to have significant difficulty hearing and understanding in minimal background noise.       The Phonemic Synthesis test was administered to assess decoding and sound blending skills through word reception.  Shelley Macias's quantitative score was 22 correct which indicates a slight but significant decoding and sound-blending deficit, even in quiet.  Remediation with computer based auditory processing program was discussed.   The Staggered Spondaic Word Test [name]) was also administered.  This test uses spondee words (familiar words consisting of two monosyllabic words with equal stress on each word) as the test stimuli.  Different words are directed to each ear, competing and non-competing.  Shelley Macias had slight abnormal qualitative signs of abnormal results in the areas of decoding and tolerance-fading memory.   Competing Sentences (CS) involved a different sentences being presented to each ear at different volumes. The instructions are to repeat  the softer volume sentences. Posterior temporal issues will show poorer performance in the ear contralateral to the lobe involved.  Shelley Macias scored 100% in the right ear and 60% in the left ear.  The test results are abnormal on the left side and are consistent with Central Auditory Processing Disorder (CAPD).  Dichotic Digits (DD) presents different two digits to each ear. All four digits are to be repeated. Poor performance suggests that cerebellar and/or brainstem may be involved. Shelley Macias scored 85% in the right ear and 85% in the left ear. The test results indicate that Shelley Macias scored abnormal in each ear which is consistent with Central Auditory Processing Disorder (CAPD).  [name]'s Frequency (Pitch) Pattern Test requires identification of high and low pitch tones presented each ear individually. Poor performance may occur with organization, learning issues or dyslexia.  Shelley Macias scored 86% in each ear which is within normal limits on this auditory processing test.   Summary of Shelley Macias's areas of difficulty: Slight Decoding with  deals with phonemic processing.  It's an inability to sound out words or difficulty associating written letters with the sounds they represent.  Decoding problems are in difficulties with reading accuracy, oral discourse, phonics and spelling, articulation, receptive language, and understanding directions.  Oral discussions and written tests are particularly difficult. This makes it difficult to understand what is said because the sounds are not readily recognized or because people speak too rapidly.  It may be possible to follow slow, simple or repetitive material, but difficult to keep up with a fast speaker as well as new or abstract material.  Slight Tolerance-Fading Memory (TFM) is associated with both difficulties understanding speech in the presence of background noise and poor short-term auditory memory.  Difficulties are usually seen in attention span, reading, comprehension and  inferences, following directions, poor handwriting, auditory figure-ground, short term memory, expressive and receptive language, inconsistent articulation, oral and written discourse, and problems with distractibility.  Poor Binaural Integration involves the ability to utilize two or more sensory modalities together such as problems tying together auditory and visual information are seen. The ability to ignore a competing or background noise may also be observed.  Dyslexia and poor handwriting are also common.    Reduced Word Recognition on the right side in Minimal Background Noise is the inability to hear in the presence of competing noise. This problem may be easily mistaken for inattention.  Hearing may be excellent in a quiet room but become very poor when a fan, air conditioner or heater come on, paper is rattled or music  is turned on. The background noise does not have to "sound loud" to a normal listener in order for it to be a problem for someone with an auditory processing disorder.     Significant Sound Sensitivity or Reduced Uncomfortable Loudness Levels (UCL) may be identified by history and/or by testing.  Sound sensitivity may be associated with hearing loss (called recruitment), auditory processing disorder and/or sensory integration disorder (sound sensitivity or hyperacusis) so that careful testing and close monitoring is recommended.   It is important that hearing protection be used when around noise levels that are loud and potentially damaging. If you notice the sound sensitivity becoming worse contact your physician.   CONCLUSIONS: Wynona Neat Groft Central Auditory Processing Disorder (CAPD) that is slight but significant in the areas of Decoding and Tolerance Fading Memory. Her primary areas of difficulty are in the areas of binaural integration with significant sound sensitivity to volumes equivalent to normal conversational speech levels.  Significant is that because of the  binaural integration component, ANNICK PINGITORE has difficulty ignoring a bothersome sound, which may contribute to auditory fatigue.   Shelley Macias has great compensation strategies. To help decode words, Autie  uses techniques that she learned when working with her son who has learning disability. For example, on the sound blending test administered today, Mitsuyo stated that she has "difficulty remembering syllables, could hear the middle sounds of words but to form the correct word then had to remember to put the first and last sound in place". To help minimize distracting background sound, TEA KUNG uses soothing environmental sounds or background music at work to help her concentrate. This is an effective strategy for her and one that is encouraged.  As discussed with Stanton Kidney, using hearing protection to reduce sound at work would not be recommended  since this may aggravate or make worse sound sensitivity when used for extended periods of time in relative quiet.    That Shelley Macias is well aware of her auditory processing difficulty, as well as how she best compensates speaks to its pervasive adverse effect in her daily life.  She should continue to be allowed to use strategies to help her hear and process at home and at work.   Wynona Neat Danielson has normal hearing thresholds in each ear although she has a slight drop at 2000Hz  on the left side which requires monitoring to rule out a progressive hearing loss - especially in conjunction with the weak to abnormal inner ear function results indicating damage to the cochlea. However, this slight decrease in hearing may be an artifact of the slightly reduced tympanic membrane movement on the left side which may occur with allergies or a cold. Therefore, a repeat hearing evaluation in 6 months is recommended and has been scheduled here.       The presence of the binaural integration component indicates that Laquanta has increased difficulty processing  auditory information when more than one thing is going on. Optimal Integration involves efficient combining of the auditory with information from the other modalities and processing Macias. It is a complex function. Integration issues include difficulty with auditory-visual integration, response delays, dyslexia/severe reading and/or spelling issues In addition to the compensation strategies already discussed,  music lessons and an at home computer based auditory processing program are recommended. Current research strongly indicates that learning to play a musical instrument results in improved neurological function related to auditory processing that benefits decoding, dyslexia and hearing in background noise.  In addition, the use of a computer based auditory processing program to improve phonological awareness such as Hear builder Phonological Awareness or LACE is strongly recommended.  Using one of these programs 10-15 minutes 4-5 days per week until completed is recommended for therapeutic benefit.    The use of technology to help with auditory weakness is also beneficial. This may be using apps on a tablet,  a recording device or using a live scribe smart pen in the classroom.  A live scribe pen records while taking notes and may be very helpful (and discrete) at work.  If Shelley Macias makes a mark (asteric or star) in the livescribe notebook she may immediately return to that specific time in the recording listen again.    RECOMMENDATIONS: 1.  Repeat audiological evaluation in 6 months to monitor a) hearing thresholds, especially on the left side b) word recognition in minimal background noise bilaterally c) uncomfortable loudness levels d) inner ear function (DPOAE's).  For your convenience an appointment has been scheduled here for April 24 , 2018 at 4pm.  Please call or change this appointment to a more convenient time,  Call for an earlier appointment if there are hearing changes or concerns  that develop.   2. Current research strongly indicates that learning to play a musical instrument results in improved neurological function related to auditory processing that benefits decoding, dyslexia and hearing in background noise. Therefore is recommended that [facility] learn to play a musical instrument for 1-2 years. Please be aware that being able to play the instrument well does not seem to matter, the benefit comes with the learning. Please refer to the following website for further info: www.brainvolts at [facility], Annia Friendly, PhD.   3.    Wynona Neat Demarinis plans to investigate each of the following programs and use one. Please note that decoding of speech and speech sounds should occur quickly and accurately. However, if it does not it may be difficult to: develop clear speech, understand what is said, have good oral reading/word accuracy/word finding/receptive language/ spelling.  The goal of decoding therapy is to improve phonemic understanding through: phonemic training for phonological awareness. Improvement in decoding is often addressed first because improvement here, helps hearing in background noise and other areas. Auditory processing self-help computer programs are available for IPAD and computer download.  Benefit has been shown with intensive use for 10-15 minutes,  4-5 days per week. Research is suggesting that using the programs for a short amount of time each day is better for the auditory processing development than completing the program in a short amount of time by doing it several hours per day. Hearbuilder.com  IPAD or PC download (Start with Phonological Awareness for decoding issues-10-15 minutes, 4-5 days per week)               LACE  (teenage to adult)     PC download or cd                                                    www.neurotone.com  4.  Other self-help measures include: 1) have conversation face to face  2) minimize background noise when having a  conversation- turn off the TV, move to a quiet area of the area 3) be aware that auditory processing problems become worse with fatigue  and stress  4) Avoid having important conversation with Christyann's back to the speaker.    5.  Use hearing protection to protect hearing from additional hearing loss. Use hearing protection during short noisy periods of time.  Do not use hearing protection for extended periods of time in relative quiet.  Musician's plugs, are available from [employer].com for music related hearing protection because there is no distortion.  Other hearing protection, such as sponge plugs (available at pharmacies) or earmuffs (available at [employer] or [employer]) are useful for noisy activities and venues.  6.  The following are sound sensitivity recommendations: 1) refocus attention away from an offending sound  2) Have periods of auditory rest during the day such as periods of time or music without words and no TV.   Listening programs are also available that are effective. However, it is important to monitor hearing and rule out sound sensitivity due to sensorineural hearing loss (damage to the cochlea) prior to a listening program -  In the [place] area, several providers such as occupational therapists, educators and the [facility]inn[facility] and Hyperacousis Macias may provide assistance.  However, if sound sensitivity is bothersome before hearing stability and/or hearing loss has been establish, a program such as cognitive behavioral therapy may be helpful since there is no listening component.    Total face to face contact time 90 minutes time followed by report writing.   Ryo Klang L. Heide Spark, [place], [place] 12/13/2015

## 2015-12-14 ENCOUNTER — Encounter: Payer: 59 | Admitting: Family Medicine

## 2015-12-18 ENCOUNTER — Ambulatory Visit (INDEPENDENT_AMBULATORY_CARE_PROVIDER_SITE_OTHER): Payer: 59 | Admitting: Family Medicine

## 2015-12-18 ENCOUNTER — Encounter: Payer: Self-pay | Admitting: Family Medicine

## 2015-12-18 VITALS — BP 102/64 | HR 58 | Temp 98.8°F | Resp 14 | Ht 61.0 in | Wt 148.0 lb

## 2015-12-18 DIAGNOSIS — Z1321 Encounter for screening for nutritional disorder: Secondary | ICD-10-CM | POA: Diagnosis not present

## 2015-12-18 DIAGNOSIS — Z13820 Encounter for screening for osteoporosis: Secondary | ICD-10-CM

## 2015-12-18 DIAGNOSIS — Z8262 Family history of osteoporosis: Secondary | ICD-10-CM | POA: Diagnosis not present

## 2015-12-18 DIAGNOSIS — F411 Generalized anxiety disorder: Secondary | ICD-10-CM | POA: Diagnosis not present

## 2015-12-18 DIAGNOSIS — Z0000 Encounter for general adult medical examination without abnormal findings: Secondary | ICD-10-CM

## 2015-12-18 DIAGNOSIS — Z13220 Encounter for screening for lipoid disorders: Secondary | ICD-10-CM

## 2015-12-18 MED ORDER — PANTOPRAZOLE SODIUM 20 MG PO TBEC: 20.0000 mg | DELAYED_RELEASE_TABLET | Freq: Every day | ORAL | 1 refills | Status: DC

## 2015-12-18 NOTE — Assessment & Plan Note (Signed)
Continue celexa as needed, advised can reduce to 10mg 

## 2015-12-18 NOTE — Patient Instructions (Addendum)
I recommend eye visit once a year I recommend dental visit every 6 months Goal is to  Exercise 30 minutes 5 days a week We will send a letter with lab results  Dexa Scan to be done Call [facility] for PAP Smear  F/U 1 year as needed

## 2015-12-18 NOTE — Progress Notes (Signed)
   Subjective:    Patient ID: Shelley Macias, female    DOB: [birth year], 50 y.o.   MRN: [id number]  Patient presents for CPE (is fasting)   Pt here for CPE, No new concerns. Colonoscopy up-to-date mammogram up-to-date. She is scheduling with her GYN for her Pap smear. Immunizations up-to-date. She does have significant family of early osteoporosis she would like to get bone density done as osteoporosis in both of her parents as well as her sister.  History of ADD she actually had some testing done by a behavioral specialist she had a swab done which showed as she did not respond well to any of the medications. She also saw an auditory specialist as she has some sensorineural problems with regards to her concentration. They recommended that she do P Anna less than simple with this. She does still take the Celexa on occasion  Due for fasting labs today she did request an A1c be done.    Review Of Systems:  GEN- denies fatigue, fever, weight loss,weakness, recent illness HEENT- denies eye drainage, change in vision, nasal discharge, CVS- denies chest pain, palpitations RESP- denies SOB, cough, wheeze ABD- denies N/V, change in stools, abd pain GU- denies dysuria, hematuria, dribbling, incontinence MSK- denies joint pain, muscle aches, injury Neuro- denies headache, dizziness, syncope, seizure activity       Objective:    BP 102/64 (BP Location: Left Arm, Patient Position: Sitting, Cuff Size: Normal)   Pulse (!) 58   Temp 98.8 F (37.1 C) (Oral)   Resp 14   Ht 5\' 1"  (1.549 m)   Wt 148 lb (67.1 kg)   SpO2 98%   BMI 27.96 kg/m  GEN- NAD, alert and oriented x3 HEENT- PERRL, EOMI, non injected sclera, pink conjunctiva, MMM, oropharynx clear Neck- Supple, no thyromegaly CVS- RRR, no murmur RESP-CTAB ABD-NABS,soft,NT,ND EXT- No edema Psych- normal affect and mood  Pulses- Radial, DP- 2+        Assessment & Plan:      Problem List Items Addressed This Visit    GAD  (generalized anxiety disorder)    Continue celexa as needed, advised can reduce to 10mg        Other Visit Diagnoses    Routine general medical examination at a health care facility    -  Primary   CPE, schedule with GYN, FASTING LABS done   Relevant Orders   CBC with Differential/Platelet   Comprehensive metabolic panel   Hemoglobin A1c   TSH   Screening cholesterol level       Relevant Orders   Lipid panel   Encounter for vitamin deficiency screening       Relevant Orders   Vitamin D, 25-hydroxy   Osteoporosis screening       Would benefit from early detection with family history, check Vitamin D level as well   Relevant Orders   DG Bone Density   Family history of osteoporosis       Relevant Orders   DG Bone Density      Note: This dictation was prepared with Dragon dictation along with smaller phrase technology. Any transcriptional errors that result from this process are unintentional.

## 2015-12-19 LAB — COMPREHENSIVE METABOLIC PANEL
ALT: 14 U/L (ref 6–29)
AST: 16 U/L (ref 10–35)
Albumin: 4.4 g/dL (ref 3.6–5.1)
Alkaline Phosphatase: 63 U/L (ref 33–130)
BUN: 10 mg/dL (ref 7–25)
CHLORIDE: 104 mmol/L (ref 98–110)
CO2: 26 mmol/L (ref 20–31)
Calcium: 9.8 mg/dL (ref 8.6–10.4)
Creat: 0.74 mg/dL (ref 0.50–1.05)
Glucose, Bld: 86 mg/dL (ref 70–99)
POTASSIUM: 3.6 mmol/L (ref 3.5–5.3)
SODIUM: 140 mmol/L (ref 135–146)
TOTAL PROTEIN: 6.9 g/dL (ref 6.1–8.1)
Total Bilirubin: 0.8 mg/dL (ref 0.2–1.2)

## 2015-12-19 LAB — CBC WITH DIFFERENTIAL/PLATELET
BASOS ABS: 0 {cells}/uL (ref 0–200)
Basophils Relative: 0 %
EOS ABS: 150 {cells}/uL (ref 15–500)
EOS PCT: 2 %
HCT: 42.3 % (ref 35.0–45.0)
Hemoglobin: 14.4 g/dL (ref 12.0–15.0)
LYMPHS PCT: 39 %
Lymphs Abs: 2925 cells/uL (ref 850–3900)
MCH: 32.2 pg (ref 27.0–33.0)
MCHC: 34 g/dL (ref 32.0–36.0)
MCV: 94.6 fL (ref 80.0–100.0)
MONOS PCT: 6 %
MPV: 10 fL (ref 7.5–12.5)
Monocytes Absolute: 450 cells/uL (ref 200–950)
NEUTROS PCT: 53 %
Neutro Abs: 3975 cells/uL (ref 1500–7800)
PLATELETS: 269 10*3/uL (ref 140–400)
RBC: 4.47 MIL/uL (ref 3.80–5.10)
RDW: 12.8 % (ref 11.0–15.0)
WBC: 7.5 10*3/uL (ref 3.8–10.8)

## 2015-12-19 LAB — LIPID PANEL
CHOL/HDL RATIO: 3.5 ratio (ref <=5.0)
Cholesterol: 187 mg/dL (ref 125–200)
HDL: 54 mg/dL (ref >=46)
LDL CALC: 114 mg/dL (ref <130)
TRIGLYCERIDES: 95 mg/dL (ref <150)
VLDL: 19 mg/dL (ref <30)

## 2015-12-19 LAB — VITAMIN D 25 HYDROXY (VIT D DEFICIENCY, FRACTURES): Vit D, 25-Hydroxy: 35 ng/mL (ref 30–100)

## 2015-12-19 LAB — HEMOGLOBIN A1C
HEMOGLOBIN A1C: 5.2 % (ref <5.7)
Mean Plasma Glucose: 103 mg/dL

## 2015-12-19 LAB — TSH: TSH: 1.52 mIU/L

## 2015-12-20 ENCOUNTER — Encounter: Payer: Self-pay | Admitting: Family Medicine

## 2015-12-27 ENCOUNTER — Other Ambulatory Visit (HOSPITAL_COMMUNITY): Payer: 59

## 2015-12-28 DIAGNOSIS — H5213 Myopia, bilateral: Secondary | ICD-10-CM | POA: Diagnosis not present

## 2016-03-10 ENCOUNTER — Other Ambulatory Visit: Payer: Self-pay | Admitting: Family Medicine

## 2016-03-10 MED FILL — CITALOPRAM HBR 20 MG TABLET: 20 | 90 days supply | Qty: 90 | Fill #0

## 2016-05-27 ENCOUNTER — Encounter: Payer: Self-pay | Admitting: Family Medicine

## 2016-05-29 ENCOUNTER — Other Ambulatory Visit: Payer: Self-pay | Admitting: Family Medicine

## 2016-05-29 DIAGNOSIS — Z79899 Other long term (current) drug therapy: Secondary | ICD-10-CM

## 2016-05-29 MED ORDER — TERBINAFINE HCL 250 MG PO TABS: 250.0000 mg | ORAL_TABLET | Freq: Every day | ORAL | 0 refills | Status: DC

## 2016-05-29 MED FILL — TERBINAFINE HCL 250 MG TAB: 250 | 90 days supply | Qty: 90 | Fill #0

## 2016-06-12 ENCOUNTER — Encounter: Payer: Self-pay | Admitting: Family Medicine

## 2016-06-16 ENCOUNTER — Other Ambulatory Visit: Payer: Self-pay | Admitting: Family Medicine

## 2016-06-16 DIAGNOSIS — Z79899 Other long term (current) drug therapy: Secondary | ICD-10-CM | POA: Diagnosis not present

## 2016-06-16 LAB — HEPATIC FUNCTION PANEL
ALT: 15 U/L (ref 6–29)
AST: 17 U/L (ref 10–35)
Albumin: 4.2 g/dL (ref 3.6–5.1)
Alkaline Phosphatase: 63 U/L (ref 33–130)
BILIRUBIN DIRECT: 0.1 mg/dL (ref <=0.2)
Indirect Bilirubin: 0.5 mg/dL (ref 0.2–1.2)
Total Bilirubin: 0.6 mg/dL (ref 0.2–1.2)
Total Protein: 6.5 g/dL (ref 6.1–8.1)

## 2016-06-17 ENCOUNTER — Encounter: Payer: Self-pay | Admitting: Family Medicine

## 2016-06-17 ENCOUNTER — Ambulatory Visit: Payer: 59 | Admitting: Audiology

## 2016-06-21 DIAGNOSIS — S91341A Puncture wound with foreign body, right foot, initial encounter: Secondary | ICD-10-CM | POA: Diagnosis not present

## 2016-06-23 ENCOUNTER — Encounter: Payer: Self-pay | Admitting: Family Medicine

## 2016-07-30 DIAGNOSIS — Z6828 Body mass index (BMI) 28.0-28.9, adult: Secondary | ICD-10-CM | POA: Diagnosis not present

## 2016-07-30 DIAGNOSIS — Z01419 Encounter for gynecological examination (general) (routine) without abnormal findings: Secondary | ICD-10-CM | POA: Diagnosis not present

## 2016-08-07 ENCOUNTER — Other Ambulatory Visit: Payer: Self-pay | Admitting: Family Medicine

## 2016-08-07 DIAGNOSIS — Z1231 Encounter for screening mammogram for malignant neoplasm of breast: Secondary | ICD-10-CM

## 2016-08-12 ENCOUNTER — Other Ambulatory Visit: Payer: Self-pay | Admitting: *Deleted

## 2016-08-12 DIAGNOSIS — Z79899 Other long term (current) drug therapy: Secondary | ICD-10-CM | POA: Diagnosis not present

## 2016-08-13 ENCOUNTER — Ambulatory Visit (HOSPITAL_COMMUNITY)
Admission: RE | Admit: 2016-08-13 | Discharge: 2016-08-13 | Disposition: A | Discharge status: Home / Self Care | Payer: 59 | Source: Ambulatory Visit | Attending: Family Medicine | Admitting: Family Medicine

## 2016-08-13 ENCOUNTER — Ambulatory Visit (HOSPITAL_COMMUNITY): Payer: 59

## 2016-08-13 DIAGNOSIS — Z1231 Encounter for screening mammogram for malignant neoplasm of breast: Secondary | ICD-10-CM | POA: Diagnosis not present

## 2016-08-13 LAB — HEPATIC FUNCTION PANEL
ALBUMIN: 4.3 g/dL (ref 3.6–5.1)
ALT: 14 U/L (ref 6–29)
AST: 14 U/L (ref 10–35)
Alkaline Phosphatase: 69 U/L (ref 33–130)
BILIRUBIN TOTAL: 0.5 mg/dL (ref 0.2–1.2)
Bilirubin, Direct: 0.1 mg/dL (ref <=0.2)
Indirect Bilirubin: 0.4 mg/dL (ref 0.2–1.2)
TOTAL PROTEIN: 6.4 g/dL (ref 6.1–8.1)

## 2016-08-13 IMAGING — MG 2D DIGITAL SCREENING BILATERAL MAMMOGRAM WITH CAD AND ADJUNCT TO
8 series · 9 of 24 positions shown · non-contrast
Comparison: Previous exam(s).

CLINICAL DATA: Screening.

EXAM:
2D DIGITAL SCREENING BILATERAL MAMMOGRAM WITH CAD AND ADJUNCT TOMO

[L MLO]
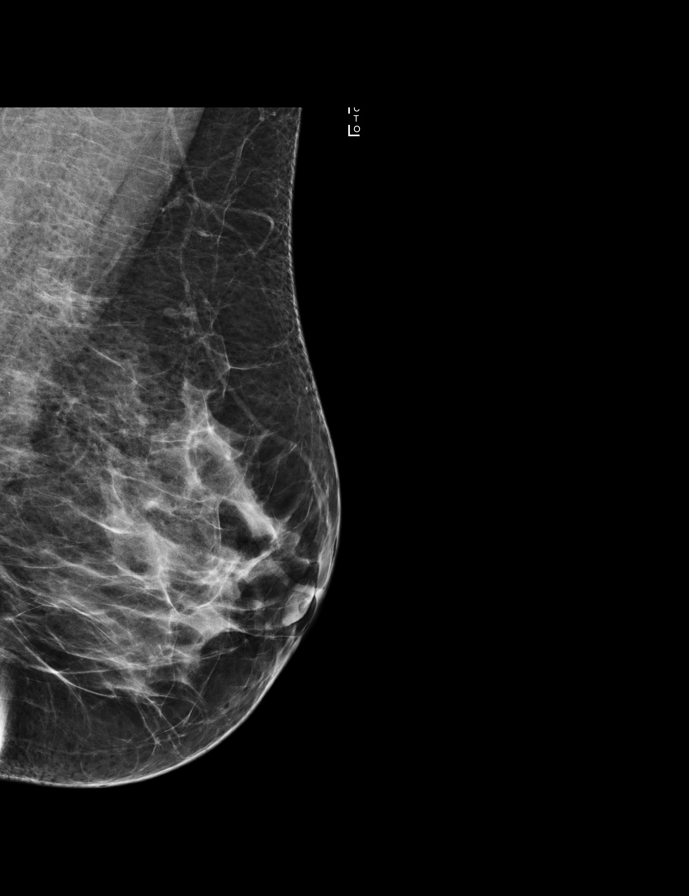

[R MLO]
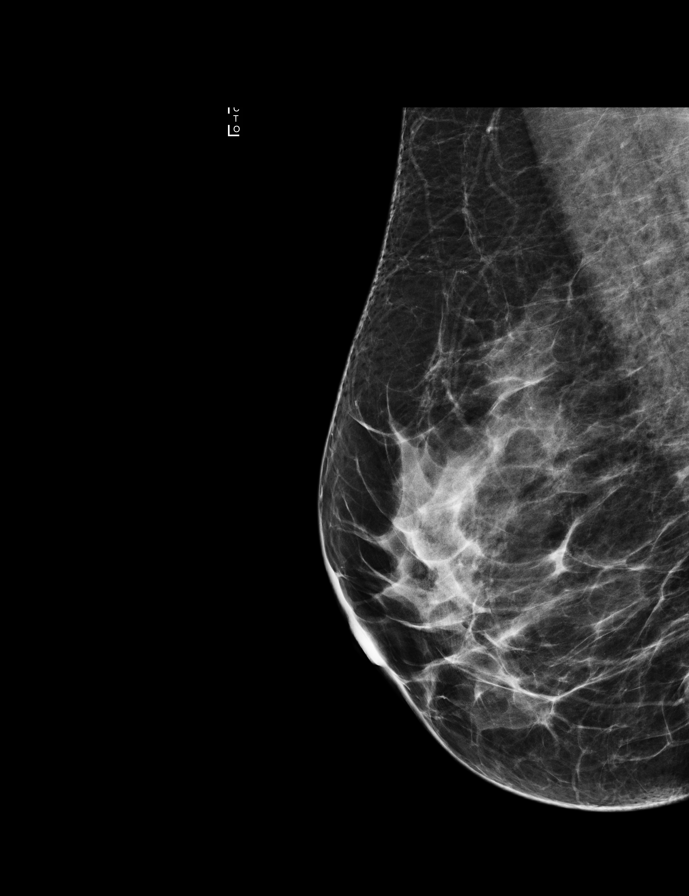

[R CC]
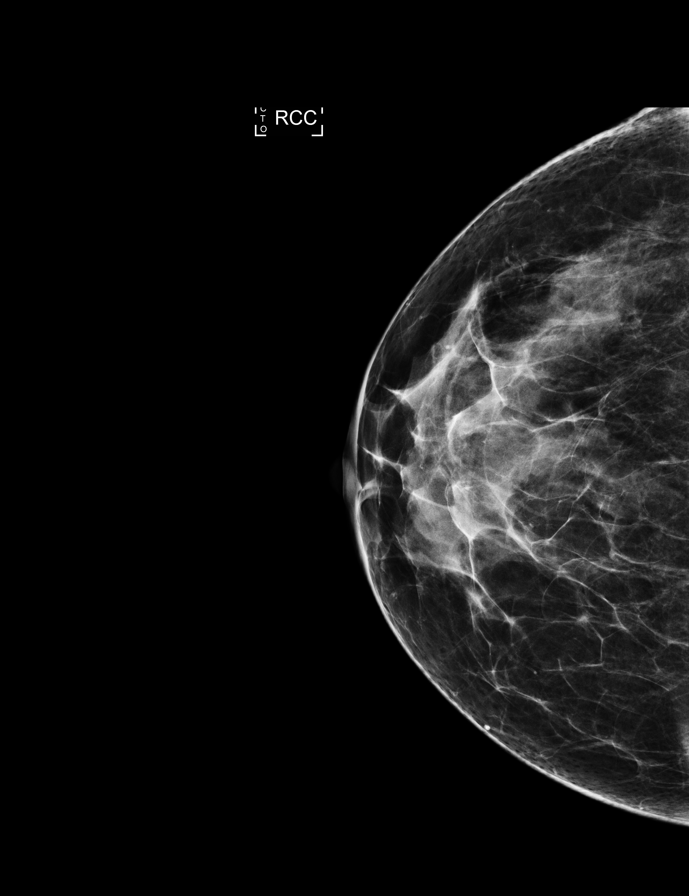

[L CC]
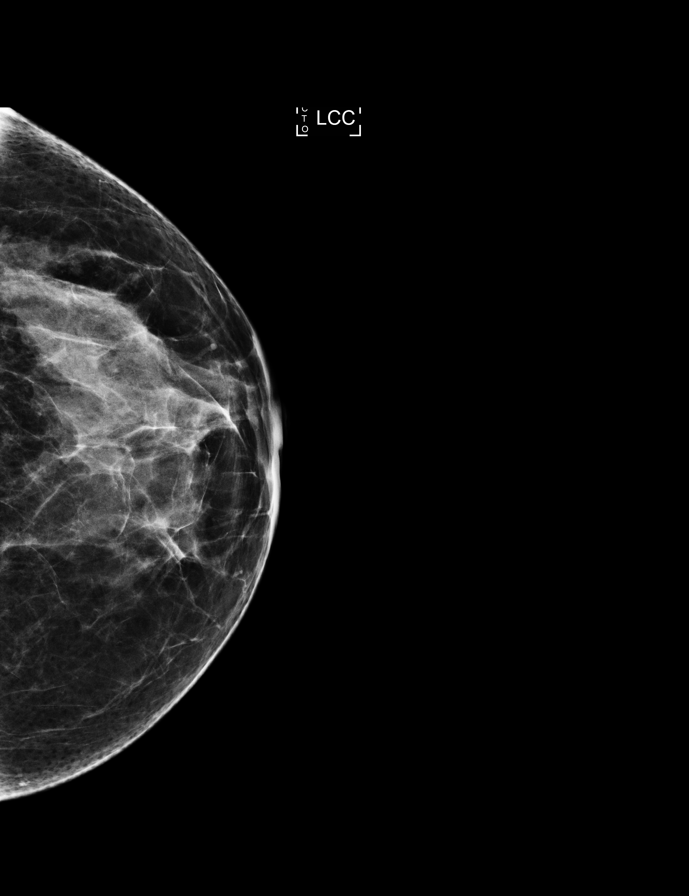

[L CC tomo · 2 of 55 frames shown]
[frame 18/55]
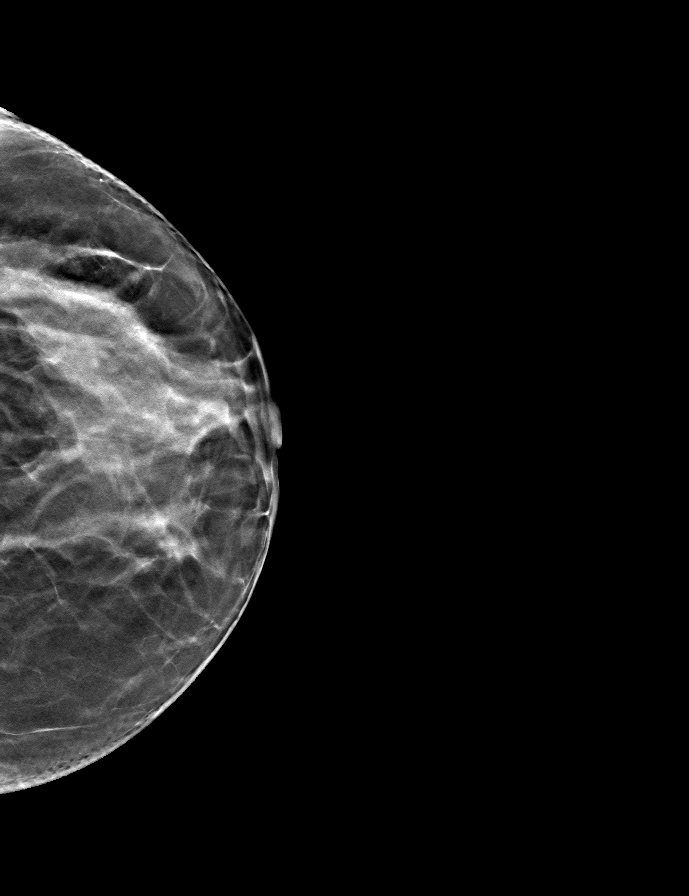
[frame 28/55]
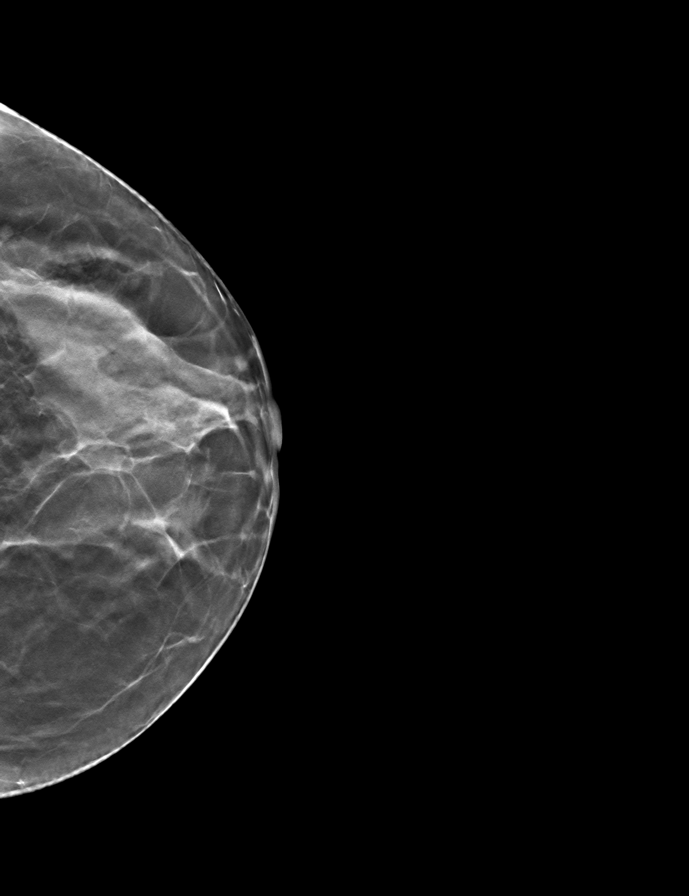

[R MLO tomo · tomo slice 31/60.0]
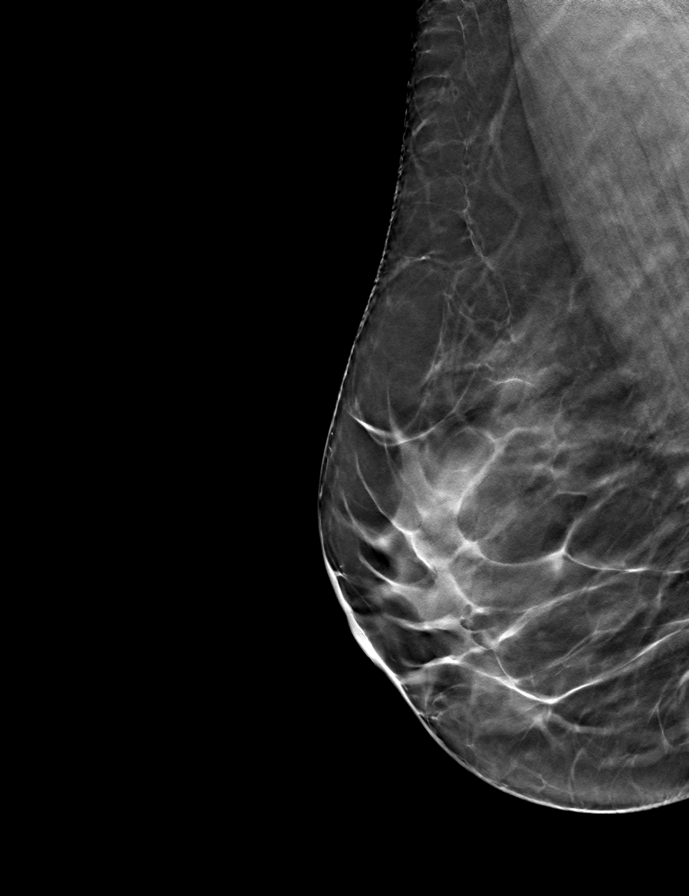

[L MLO tomo · tomo slice 31/62.0]
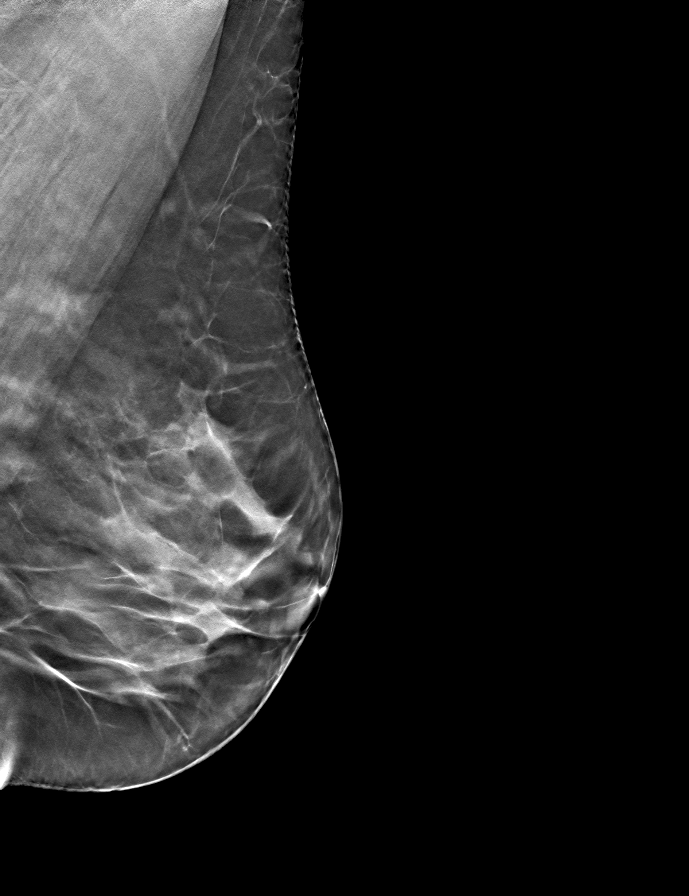

[R CC tomo · tomo slice 31/61.0]
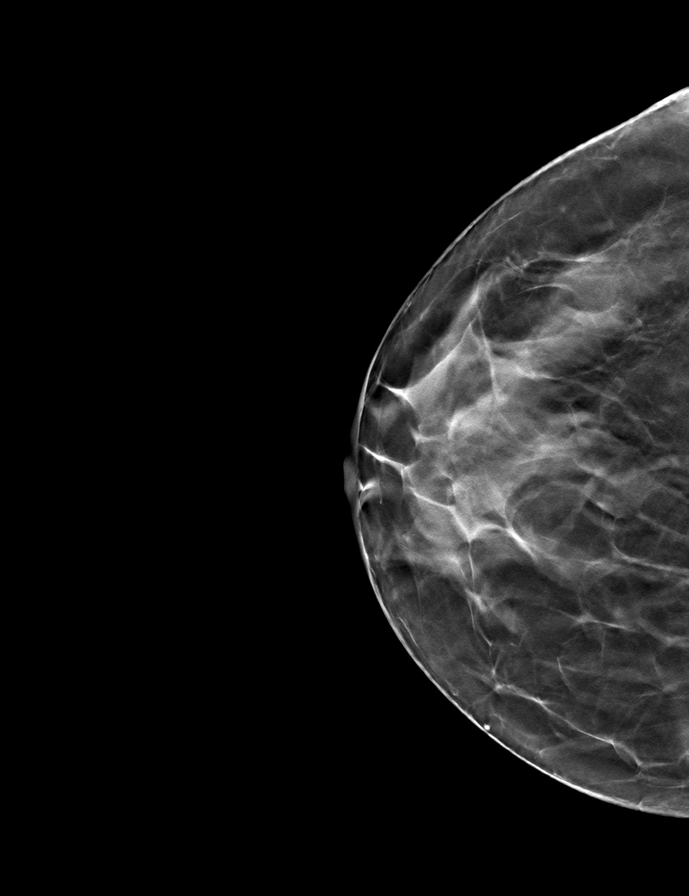

[9 of 24 positions shown; findings below may reference images not displayed]

ACR Breast Density Category c: The breast tissue is heterogeneously
dense, which may obscure small masses.
FINDINGS: There are no findings suspicious for malignancy. Images were
processed with CAD.
IMPRESSION: No mammographic evidence of malignancy. A result letter of this
screening mammogram will be mailed directly to the patient.

RECOMMENDATION:
Screening mammogram in one year. (Code:[TA])

BI-RADS CATEGORY  1: Negative.

## 2016-09-22 ENCOUNTER — Encounter: Payer: Self-pay | Admitting: Family Medicine

## 2016-09-22 MED ORDER — CITALOPRAM HYDROBROMIDE 20 MG PO TABS: 20.0000 mg | ORAL_TABLET | Freq: Every day | ORAL | 3 refills | Status: DC

## 2016-10-01 ENCOUNTER — Other Ambulatory Visit: Payer: Self-pay | Admitting: Family Medicine

## 2016-10-01 MED FILL — CITALOPRAM HBR 20 MG TABLET: 20 | 90 days supply | Qty: 90 | Fill #1

## 2016-10-01 MED FILL — PANTOPRAZOLE SOD DR 20 MG T: 20 | 90 days supply | Qty: 90 | Fill #0

## 2017-01-30 DIAGNOSIS — H5213 Myopia, bilateral: Secondary | ICD-10-CM | POA: Diagnosis not present

## 2017-03-31 ENCOUNTER — Other Ambulatory Visit: Payer: Self-pay | Admitting: Family Medicine

## 2017-03-31 MED FILL — CITALOPRAM HBR 20 MG TABLET: 20 | 90 days supply | Qty: 90 | Fill #0

## 2017-04-10 ENCOUNTER — Ambulatory Visit (INDEPENDENT_AMBULATORY_CARE_PROVIDER_SITE_OTHER): Payer: 59 | Admitting: Family Medicine

## 2017-04-10 ENCOUNTER — Encounter: Payer: Self-pay | Admitting: Family Medicine

## 2017-04-10 VITALS — BP 130/78 | HR 78 | Temp 98.0°F | Resp 16 | Ht 61.0 in | Wt 148.0 lb

## 2017-04-10 DIAGNOSIS — F411 Generalized anxiety disorder: Secondary | ICD-10-CM | POA: Diagnosis not present

## 2017-04-10 DIAGNOSIS — F329 Major depressive disorder, single episode, unspecified: Secondary | ICD-10-CM | POA: Insufficient documentation

## 2017-04-10 DIAGNOSIS — F4321 Adjustment disorder with depressed mood: Secondary | ICD-10-CM

## 2017-04-10 MED ORDER — CITALOPRAM HYDROBROMIDE 40 MG PO TABS: 40.0000 mg | ORAL_TABLET | Freq: Every day | ORAL | 2 refills | Status: DC

## 2017-04-10 MED ORDER — CITALOPRAM HYDROBROMIDE 40 MG PO TABS: 40.0000 mg | ORAL_TABLET | Freq: Every day | ORAL | 1 refills | Status: DC

## 2017-04-10 NOTE — Assessment & Plan Note (Signed)
Increased stress with dealing with her sick parents.  She also works full-time and has stressors in general on her own.  She has history of anxiety but now with some newer depressive symptoms.  She is going to continue with her therapist whom she sees at [facility].  We will increase her Celexa to 40 mg.  Because trying hydroxyzine as a as needed she states that she would try the Celexa at the higher milligrams first.  Will email me in a couple of weeks and see how she is doing.

## 2017-04-10 NOTE — Progress Notes (Signed)
   Subjective:    Patient ID: Shelley Macias, female    DOB: [birth year], 52 y.o.   MRN: [id number]  Patient presents for Discuss medications   Incresaed stressors dealing with her sick parents past month or so. SHe though the had doubled up on her celexa to 40mg  but had actuallt been taking protonix. She just realized this yesterday States things have been quite stressful, she has found it hard to concentrate even with her work.  She is not sleeping as well.  She always feels on edge.  Her siblings are trying to help some but her oldest brother who is the healthcare power of attorney has been making demands and belittling her.  She is actually sought out therapy with her job which has helped.  She is trying to distance herself from the negativity with her family also helping to help care for her parents.  Like to actually try the higher dose of Celexa to see how this helps with her mood.   Review Of Systems:  GEN- denies fatigue, fever, weight loss,weakness, recent illness HEENT- denies eye drainage, change in vision, nasal discharge, CVS- denies chest pain, palpitations RESP- denies SOB, cough, wheeze ABD- denies N/V, change in stools, abd pain GU- denies dysuria, hematuria, dribbling, incontinence MSK- denies joint pain, muscle aches, injury Neuro- denies headache, dizziness, syncope, seizure activity       Objective:    BP 130/78   Pulse 78   Temp 98 F (36.7 C) (Oral)   Resp 16   Ht 5\' 1"  (1.549 m)   Wt 148 lb (67.1 kg)   SpO2 98%   BMI 27.96 kg/m  GEN- NAD, alert and oriented x3 Psych- stressed appearing, no SI, well groomed, normal speech, not anxios appearing        Assessment & Plan:      Problem List Items Addressed This Visit      Unprioritized   GAD (generalized anxiety disorder) - Primary   Relevant Medications   citalopram (CELEXA) 40 MG tablet   Situational depression    Increased stress with dealing with her sick parents.  She also works full-time  and has stressors in general on her own.  She has history of anxiety but now with some newer depressive symptoms.  She is going to continue with her therapist whom she sees at [facility].  We will increase her Celexa to 40 mg.  Because trying hydroxyzine as a as needed she states that she would try the Celexa at the higher milligrams first.  Will email me in a couple of weeks and see how she is doing.      Relevant Medications   citalopram (CELEXA) 40 MG tablet      Note: This dictation was prepared with Dragon dictation along with smaller phrase technology. Any transcriptional errors that result from this process are unintentional.

## 2017-04-10 NOTE — Patient Instructions (Addendum)
F/U AS Previous for Physical

## 2017-05-19 ENCOUNTER — Encounter: Payer: Self-pay | Admitting: Family Medicine

## 2017-05-19 MED FILL — CITALOPRAM HBR 40 MG TABLET: 40 | 90 days supply | Qty: 90 | Fill #0

## 2017-06-30 ENCOUNTER — Other Ambulatory Visit: Payer: Self-pay | Admitting: Family Medicine

## 2017-06-30 DIAGNOSIS — Z1231 Encounter for screening mammogram for malignant neoplasm of breast: Secondary | ICD-10-CM

## 2017-07-07 ENCOUNTER — Other Ambulatory Visit: Payer: Self-pay

## 2017-07-07 ENCOUNTER — Ambulatory Visit (INDEPENDENT_AMBULATORY_CARE_PROVIDER_SITE_OTHER): Payer: 59 | Admitting: Family Medicine

## 2017-07-07 ENCOUNTER — Encounter: Payer: Self-pay | Admitting: Family Medicine

## 2017-07-07 VITALS — BP 118/66 | HR 60 | Temp 97.9°F | Resp 14 | Ht 61.0 in | Wt 147.0 lb

## 2017-07-07 DIAGNOSIS — F4321 Adjustment disorder with depressed mood: Secondary | ICD-10-CM | POA: Diagnosis not present

## 2017-07-07 DIAGNOSIS — F411 Generalized anxiety disorder: Secondary | ICD-10-CM | POA: Diagnosis not present

## 2017-07-07 DIAGNOSIS — Z114 Encounter for screening for human immunodeficiency virus [HIV]: Secondary | ICD-10-CM | POA: Diagnosis not present

## 2017-07-07 DIAGNOSIS — Z1321 Encounter for screening for nutritional disorder: Secondary | ICD-10-CM | POA: Diagnosis not present

## 2017-07-07 DIAGNOSIS — Z0184 Encounter for antibody response examination: Secondary | ICD-10-CM | POA: Diagnosis not present

## 2017-07-07 DIAGNOSIS — Z0000 Encounter for general adult medical examination without abnormal findings: Secondary | ICD-10-CM

## 2017-07-07 MED ORDER — CITALOPRAM HYDROBROMIDE 40 MG PO TABS: 40.0000 mg | ORAL_TABLET | Freq: Every day | ORAL | 2 refills | Status: DC

## 2017-07-07 NOTE — Progress Notes (Signed)
   Subjective:    Patient ID: Shelley Macias, female    DOB: 1965-08-22, 52 y.o.   MRN: [id number]  Patient presents for CPE (is fasting)   Pt here for CPE  Mammogram scheduled for June   Colonoscopy UTD  Follows with eye doctor and dentist  Up-to-date with [facility]    Doing well on her Celexa feels that things are much better when she is handling stress.  Things are better for her parents as well right now.  She is doing well on the job.  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   She is due for fasting labs  Review Of Systems:  GEN- denies fatigue, fever, weight loss,weakness, recent illness HEENT- denies eye drainage, change in vision, nasal discharge, CVS- denies chest pain, palpitations RESP- denies SOB, cough, wheeze ABD- denies N/V, change in stools, abd pain GU- denies dysuria, hematuria, dribbling, incontinence MSK- denies joint pain, muscle aches, injury Neuro- denies headache, dizziness, syncope, seizure activity       Objective:    BP 118/66   Pulse 60   Temp 97.9 F (36.6 C) (Oral)   Resp 14   Ht 5' 1" (1.549 m)   Wt 147 lb (66.7 kg)   SpO2 99%   BMI 27.78 kg/m  GEN- NAD, alert and oriented x3 HEENT- PERRL, EOMI, non injected sclera, pink conjunctiva, MMM, oropharynx clear Neck- Supple, no thyromegaly CVS- RRR, no murmur RESP-CTAB ABD-NABS,soft,NT,ND Psych- normal affect and mood  EXT- No edema Pulses- Radial, DP- 2+        Assessment & Plan:      Problem List Items Addressed This Visit      Unprioritized   GAD (generalized anxiety disorder)   Relevant Medications   citalopram (CELEXA) 40 MG tablet   Situational depression    Improved with celexa, doing well on medication      Relevant Medications   citalopram (CELEXA) 40 MG tablet    Other Visit Diagnoses    Routine general medical examination at a health care facility    -  Primary   CPE done, obtain records from GYN,  fasting labs, HIV screening, MMR titer for her work   Relevant Orders   CBC with Differential/Platelet   Comprehensive metabolic panel   Hemoglobin A1c   Lipid panel   TSH   Encounter for screening for HIV       Relevant Orders   HIV antibody   Immunity status testing       Relevant Orders   Measles/Mumps/Rubella Immunity   Encounter for vitamin deficiency screening       Relevant Orders   Vitamin D, 25-hydroxy      Note: This dictation was prepared with Dragon dictation along with smaller phrase technology. Any transcriptional errors that result from this process are unintentional.

## 2017-07-07 NOTE — Patient Instructions (Signed)
TDAP at employee health  Release of records- [facility], last PAP SMEAR F/U 1 year for Physical

## 2017-07-07 NOTE — Assessment & Plan Note (Signed)
Improved with celexa, doing well on medication

## 2017-07-08 LAB — COMPREHENSIVE METABOLIC PANEL
AG RATIO: 1.8 (calc) (ref 1.0–2.5)
ALT: 16 U/L (ref 6–29)
AST: 19 U/L (ref 10–35)
Albumin: 4.3 g/dL (ref 3.6–5.1)
Alkaline phosphatase (APISO): 64 U/L (ref 33–130)
BUN: 13 mg/dL (ref 7–25)
CHLORIDE: 104 mmol/L (ref 98–110)
CO2: 26 mmol/L (ref 20–32)
CREATININE: 0.72 mg/dL (ref 0.50–1.05)
Calcium: 9.5 mg/dL (ref 8.6–10.4)
GLOBULIN: 2.4 g/dL (ref 1.9–3.7)
GLUCOSE: 79 mg/dL (ref 65–99)
POTASSIUM: 5 mmol/L (ref 3.5–5.3)
SODIUM: 140 mmol/L (ref 135–146)
TOTAL PROTEIN: 6.7 g/dL (ref 6.1–8.1)
Total Bilirubin: 0.9 mg/dL (ref 0.2–1.2)

## 2017-07-08 LAB — CBC WITH DIFFERENTIAL/PLATELET
BASOS ABS: 33 {cells}/uL (ref 0–200)
Basophils Relative: 0.5 %
EOS PCT: 4.6 %
Eosinophils Absolute: 299 cells/uL (ref 15–500)
HEMATOCRIT: 41.3 % (ref 35.0–45.0)
Hemoglobin: 14.4 g/dL (ref 11.7–15.5)
LYMPHS ABS: 2230 {cells}/uL (ref 850–3900)
MCH: 32.7 pg (ref 27.0–33.0)
MCHC: 34.9 g/dL (ref 32.0–36.0)
MCV: 93.7 fL (ref 80.0–100.0)
MPV: 10 fL (ref 7.5–12.5)
Monocytes Relative: 5.5 %
Neutro Abs: 3582 cells/uL (ref 1500–7800)
Neutrophils Relative %: 55.1 %
PLATELETS: 276 10*3/uL (ref 140–400)
RBC: 4.41 10*6/uL (ref 3.80–5.10)
RDW: 11.8 % (ref 11.0–15.0)
TOTAL LYMPHOCYTE: 34.3 %
WBC mixed population: 358 cells/uL (ref 200–950)
WBC: 6.5 10*3/uL (ref 3.8–10.8)

## 2017-07-08 LAB — LIPID PANEL
CHOL/HDL RATIO: 3.7 (calc) (ref <5.0)
CHOLESTEROL: 180 mg/dL (ref <200)
HDL: 49 mg/dL — AB (ref >50)
LDL Cholesterol (Calc): 113 mg/dL (calc) — ABNORMAL HIGH
Non-HDL Cholesterol (Calc): 131 mg/dL (calc) — ABNORMAL HIGH (ref <130)
Triglycerides: 84 mg/dL (ref <150)

## 2017-07-08 LAB — HIV ANTIBODY (ROUTINE TESTING W REFLEX): HIV 1&2 Ab, 4th Generation: NONREACTIVE

## 2017-07-08 LAB — VITAMIN D 25 HYDROXY (VIT D DEFICIENCY, FRACTURES): Vit D, 25-Hydroxy: 31 ng/mL (ref 30–100)

## 2017-07-08 LAB — TSH: TSH: 1.6 mIU/L

## 2017-07-08 LAB — MEASLES/MUMPS/RUBELLA IMMUNITY
MUMPS IGG: 98.5 [AU]/ml
Rubella: 5.32 index

## 2017-07-08 LAB — HEMOGLOBIN A1C
HEMOGLOBIN A1C: 5.3 %{Hb} (ref <5.7)
MEAN PLASMA GLUCOSE: 105 (calc)
eAG (mmol/L): 5.8 (calc)

## 2017-07-10 ENCOUNTER — Encounter: Payer: Self-pay | Admitting: *Deleted

## 2017-07-31 ENCOUNTER — Encounter: Payer: Self-pay | Admitting: *Deleted

## 2017-08-03 ENCOUNTER — Encounter: Payer: Self-pay | Admitting: Family Medicine

## 2017-08-14 ENCOUNTER — Ambulatory Visit: Payer: 59 | Admitting: Family Medicine

## 2017-08-14 ENCOUNTER — Telehealth: Payer: 59 | Admitting: Family Medicine

## 2017-08-14 DIAGNOSIS — N390 Urinary tract infection, site not specified: Secondary | ICD-10-CM | POA: Diagnosis not present

## 2017-08-14 MED ORDER — NITROFURANTOIN MONOHYD MACRO 100 MG PO CAPS: 100.0000 mg | ORAL_CAPSULE | Freq: Two times a day (BID) | ORAL | 0 refills | Status: AC

## 2017-08-14 NOTE — Progress Notes (Signed)

## 2017-08-17 ENCOUNTER — Ambulatory Visit (HOSPITAL_COMMUNITY): Payer: 59

## 2017-08-17 ENCOUNTER — Encounter (HOSPITAL_COMMUNITY): Payer: Self-pay

## 2017-08-17 ENCOUNTER — Ambulatory Visit (HOSPITAL_COMMUNITY)
Admission: RE | Admit: 2017-08-17 | Discharge: 2017-08-17 | Disposition: A | Discharge status: Home / Self Care | Payer: 59 | Source: Ambulatory Visit | Attending: Family Medicine | Admitting: Family Medicine

## 2017-08-17 DIAGNOSIS — Z1231 Encounter for screening mammogram for malignant neoplasm of breast: Secondary | ICD-10-CM | POA: Insufficient documentation

## 2017-08-17 IMAGING — MG DIGITAL SCREENING BILATERAL MAMMOGRAM WITH TOMO AND CAD
8 series · 9 of 24 positions shown · non-contrast
Comparison: Previous exam(s).

CLINICAL DATA: Screening.

EXAM:
DIGITAL SCREENING BILATERAL MAMMOGRAM WITH TOMO AND CAD

[L MLO]
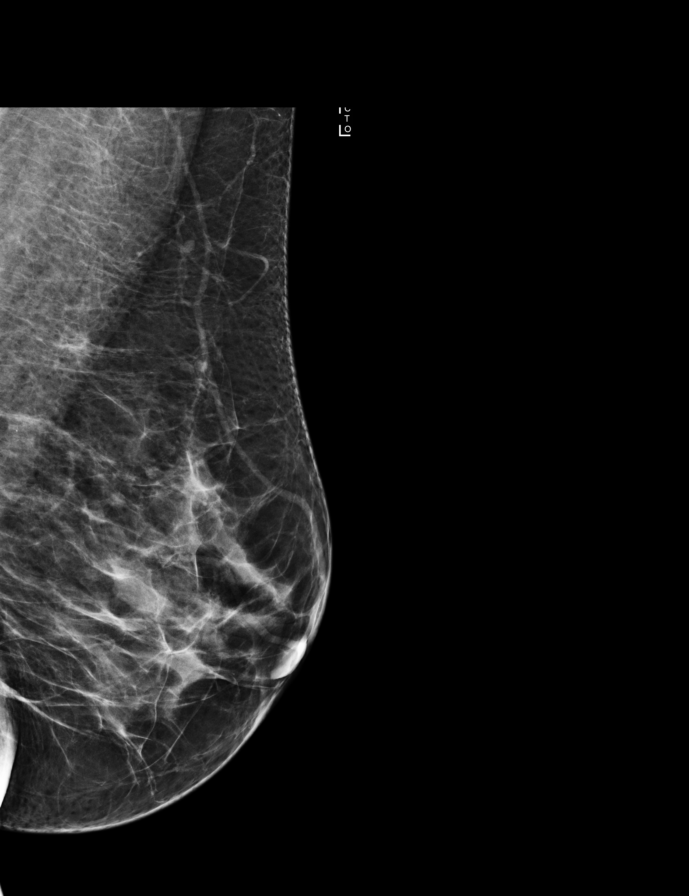

[R MLO]
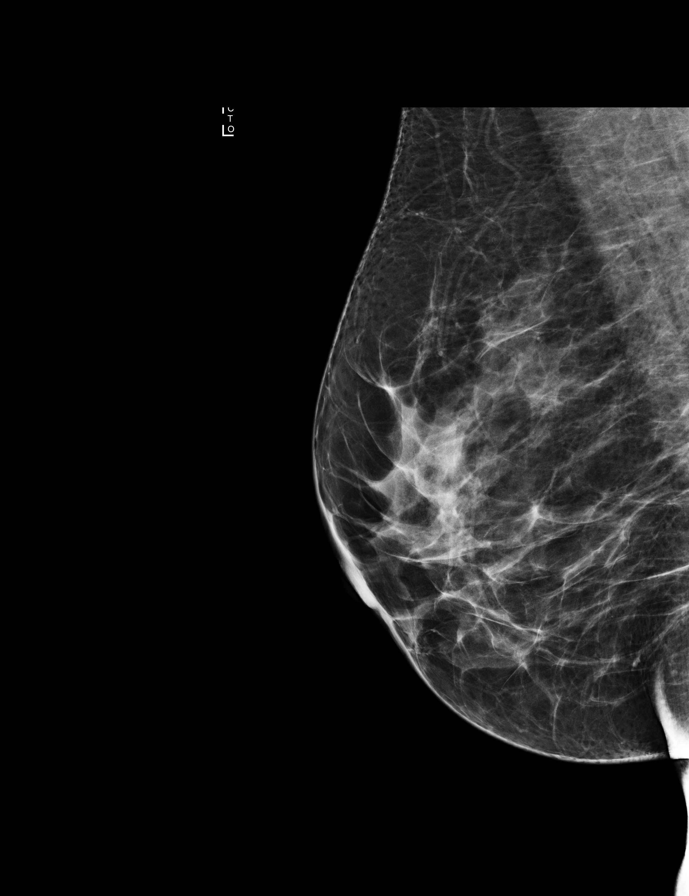

[L CC]
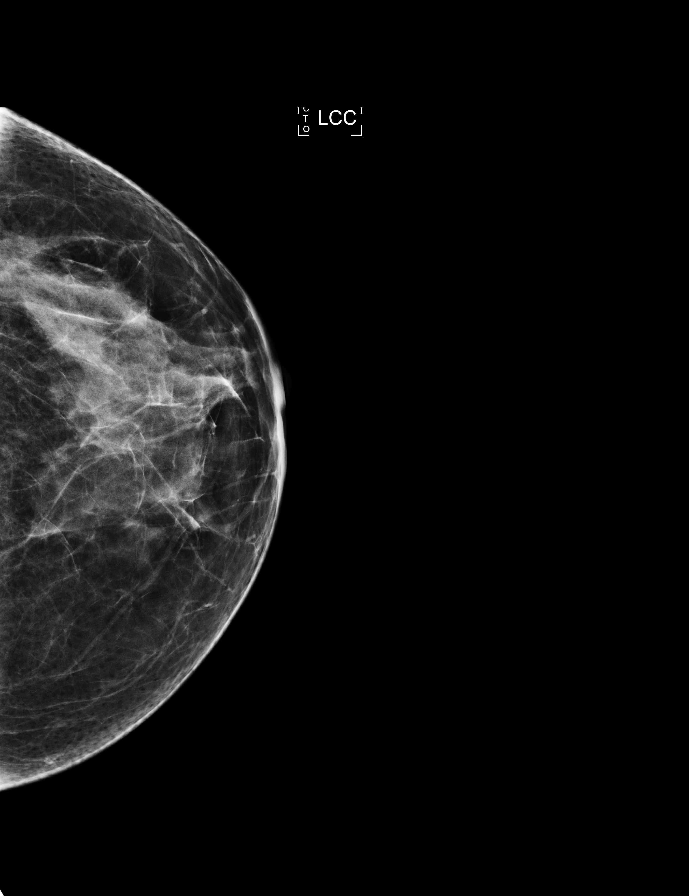

[R CC]
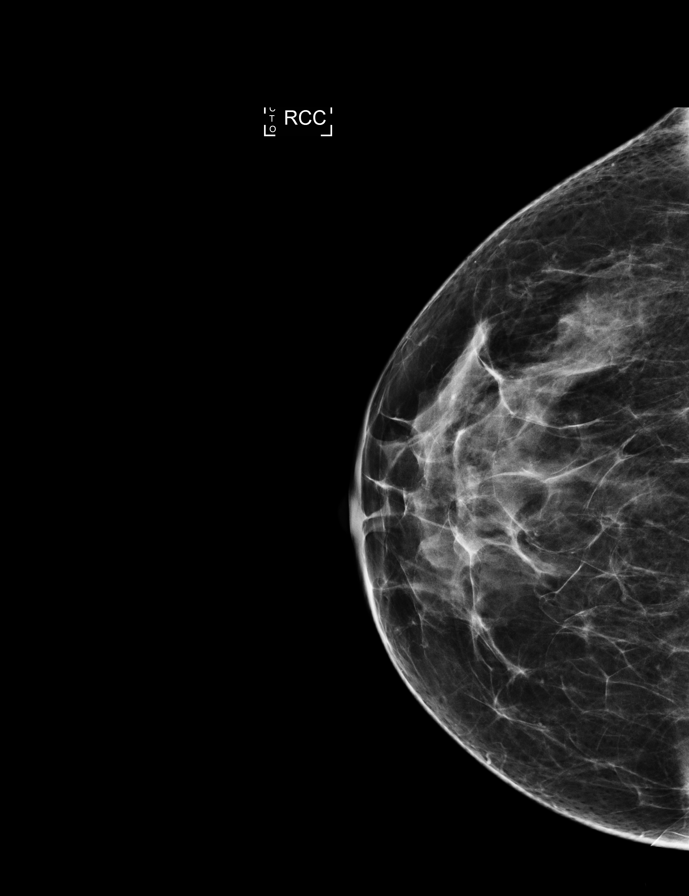

[L MLO tomo · 2 of 74 frames shown]
[frame 24/74]
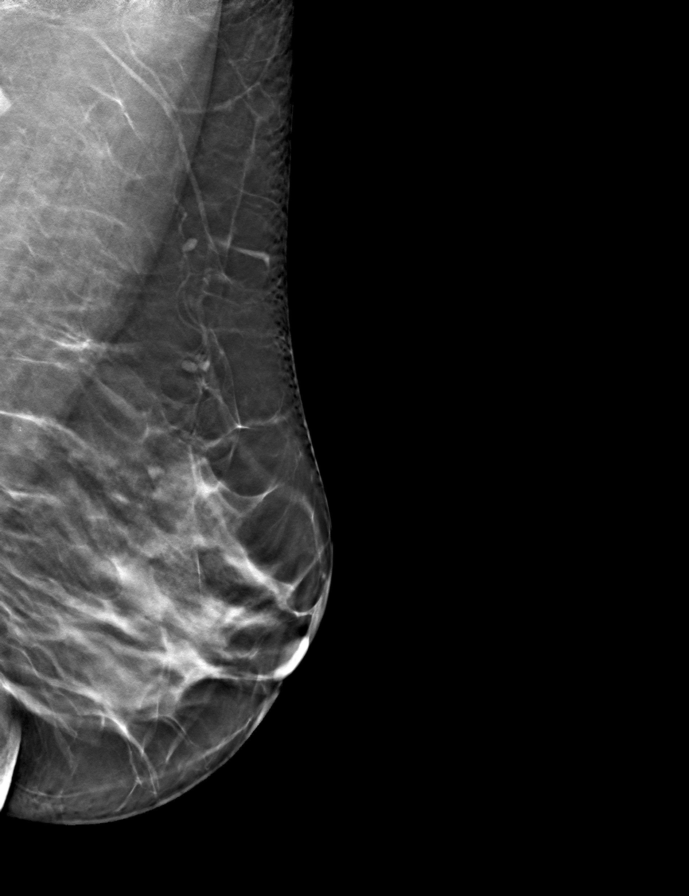
[frame 37/74]
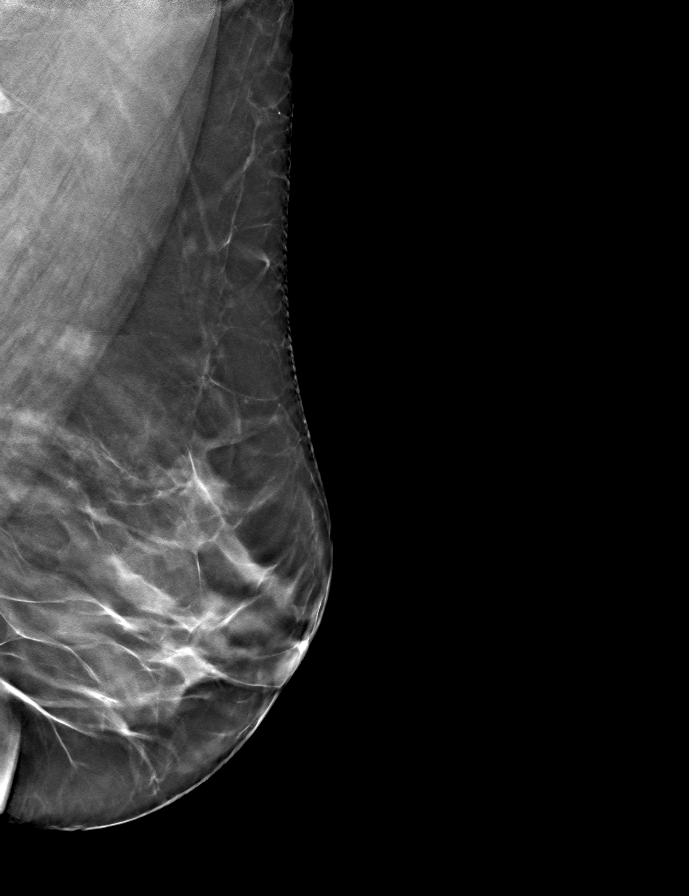

[L CC tomo · tomo slice 33/66.0]
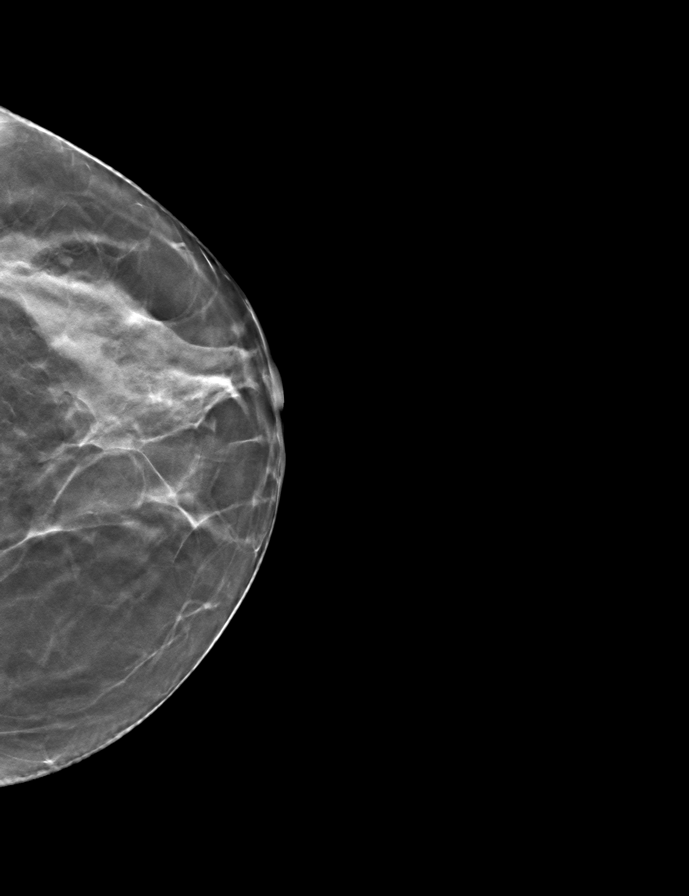

[R MLO tomo · tomo slice 36/71.0]
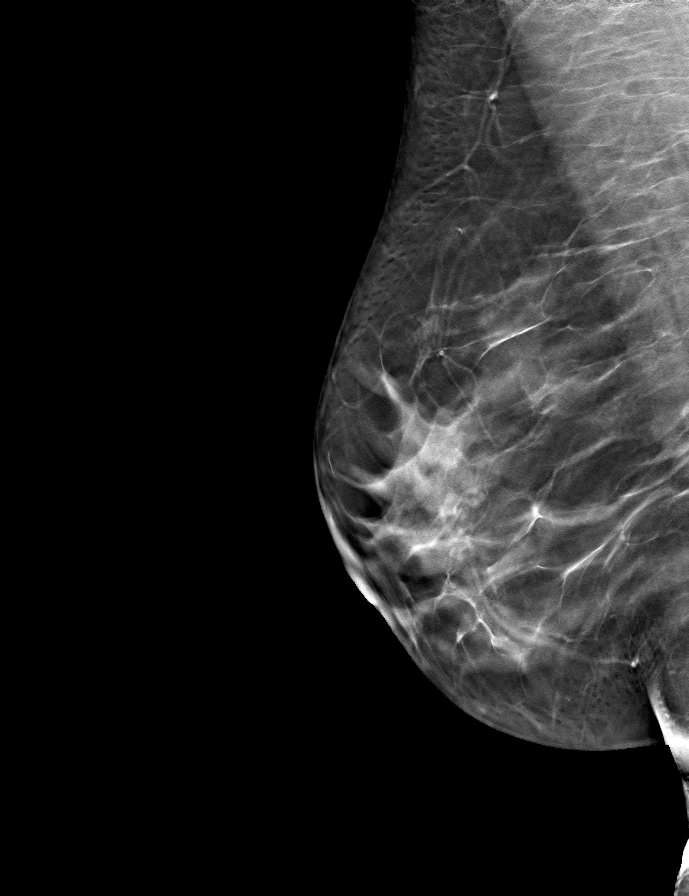

[R CC tomo · tomo slice 33/66.0]
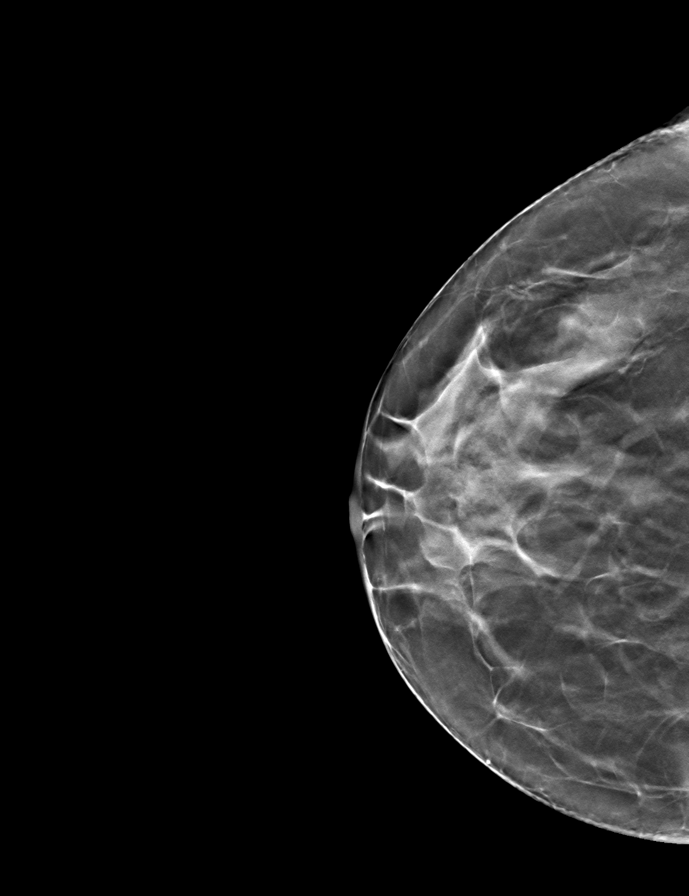

[9 of 24 positions shown; findings below may reference images not displayed]

ACR Breast Density Category c: The breast tissue is heterogeneously
dense, which may obscure small masses.
FINDINGS: There are no findings suspicious for malignancy. Images were
processed with CAD.
IMPRESSION: No mammographic evidence of malignancy. A result letter of this
screening mammogram will be mailed directly to the patient.

RECOMMENDATION:
Screening mammogram in one year. (Code:[5V])

BI-RADS CATEGORY  1: Negative.

## 2017-08-25 ENCOUNTER — Encounter: Payer: Self-pay | Admitting: Family Medicine

## 2017-09-03 MED FILL — CITALOPRAM HBR 40 MG TABLET: 40 | 90 days supply | Qty: 90 | Fill #1

## 2017-09-28 DIAGNOSIS — Z13820 Encounter for screening for osteoporosis: Secondary | ICD-10-CM | POA: Diagnosis not present

## 2017-09-28 DIAGNOSIS — Z01419 Encounter for gynecological examination (general) (routine) without abnormal findings: Secondary | ICD-10-CM | POA: Diagnosis not present

## 2017-09-28 DIAGNOSIS — Z6829 Body mass index (BMI) 29.0-29.9, adult: Secondary | ICD-10-CM | POA: Diagnosis not present

## 2017-12-16 MED FILL — CITALOPRAM HBR 40 MG TABLET: 40 | 90 days supply | Qty: 90 | Fill #0

## 2018-03-30 MED FILL — CITALOPRAM HBR 40 MG TABLET: 40 | 90 days supply | Qty: 90 | Fill #1

## 2018-07-07 MED FILL — CITALOPRAM HBR 40 MG TABLET: 40 | 90 days supply | Qty: 90 | Fill #0

## 2018-07-12 ENCOUNTER — Encounter: Payer: 59 | Admitting: Family Medicine

## 2018-07-21 ENCOUNTER — Other Ambulatory Visit: Payer: Self-pay

## 2018-07-21 ENCOUNTER — Ambulatory Visit (INDEPENDENT_AMBULATORY_CARE_PROVIDER_SITE_OTHER): Payer: 59 | Admitting: Family Medicine

## 2018-07-21 VITALS — BP 108/66 | HR 61 | Temp 98.4°F | Resp 18 | Ht 60.0 in | Wt 149.6 lb

## 2018-07-21 DIAGNOSIS — Z0000 Encounter for general adult medical examination without abnormal findings: Secondary | ICD-10-CM

## 2018-07-21 DIAGNOSIS — F411 Generalized anxiety disorder: Secondary | ICD-10-CM

## 2018-07-21 DIAGNOSIS — Z23 Encounter for immunization: Secondary | ICD-10-CM | POA: Diagnosis not present

## 2018-07-21 DIAGNOSIS — M542 Cervicalgia: Secondary | ICD-10-CM | POA: Diagnosis not present

## 2018-07-21 DIAGNOSIS — M5432 Sciatica, left side: Secondary | ICD-10-CM

## 2018-07-21 DIAGNOSIS — Z0001 Encounter for general adult medical examination with abnormal findings: Secondary | ICD-10-CM

## 2018-07-21 NOTE — Patient Instructions (Addendum)
Get xray of the neck /back and hips  We will call with lab results  TDAP given  F/U 1 year for physical

## 2018-07-21 NOTE — Progress Notes (Signed)
Subjective:    Patient ID: Shelley Macias, female    DOB: March 20, 1965, 53 y.o.   MRN: [id number]  Patient presents for Annual Exam   Pt here for CPE. Medications reviewed.   GAD- taking celexa daily when which has been working well  Diet mountain dew Constipation- will take laxative or stool softener as needed, , will have some blood in stool from straining  Has known Hemmorroids     December was walking at the beach, has had some left leg discomfort   no numbness or tingling in ext, no change in bowel or bladder  Has more soreness with walking , pain when she gets up in her hips but more on her sit bones not the lateral hips, also has chronic back pain, she has treated through years with massage and PT exercises  She has also had neck pain past few months, has had massage which helps some , she has aching and tightness, worse in the morning from sleeping. Has tried different pillows still has pain,no radicular symptoms No pain sitting at desk   Has not used any NSAIDS/Tylenol   Colonoscopy UTD 2016 Due for Mammogram   Due for TDAP /declines shingles vaccine   [facility] is GYN  Review Of Systems:  GEN- denies fatigue, fever, weight loss,weakness, recent illness HEENT- denies eye drainage, change in vision, nasal discharge, CVS- denies chest pain, palpitations RESP- denies SOB, cough, wheeze ABD- denies N/V, change in stools, abd pain GU- denies dysuria, hematuria, dribbling, incontinence MSK- + joint pain, muscle aches, injury Neuro- denies headache, dizziness, syncope, seizure activity       Objective:    BP 108/66   Pulse 61   Temp 98.4 F (36.9 C)   Resp 18   Ht 5' (1.524 m)   Wt 149 lb 9.6 oz (67.9 kg)   SpO2 93%   BMI 29.22 kg/m  GEN- NAD, alert and oriented x3 HEENT- PERRL, EOMI, non injected sclera, pink conjunctiva, MMM, oropharynx clear Neck- Supple, no thyromegaly, good ROM, neg spurlings, mild TTP at base of C spine, prominence  noted CVS- RRR, no murmur RESP-CTAB ABD-NABS,soft,NT,ND MSK- Lumbar spine NT, fair ROM, fair ROM bilat hips, no click, neg SLR, good ROM knees, no effusion Neuro- strength equal bilat LE,sensation in tact  EXT- No edema Pulses- Radial, DP- 2+        Assessment & Plan:      Problem List Items Addressed This Visit      Unprioritized   GAD (generalized anxiety disorder)    Doing well on celexa no changes       Other Visit Diagnoses    Routine general medical examination at a health care facility    -  Primary   CPE done, obtain fasting labs, obtain GYN records, declines shingles vaccine, TDAP given   Relevant Orders   CBC with Differential/Platelet (Completed)   Comprehensive metabolic panel (Completed)   Lipid panel (Completed)   Hemoglobin A1c (Completed)   TSH (Completed)   Vitamin D, 25-hydroxy (Completed)   Neck pain       likely postural but does have mild prominence around C4-C5 obtain xray   Relevant Orders   DG Cervical Spine Complete   Left sided sciatica       Back and hip pain which is chronic, no red flags, but obtain xray back and hips   Relevant Orders   DG Lumbar Spine Complete   DG HIPS BILAT WITH PELVIS 3-4 VIEWS  Need for DTaP vaccine       Relevant Orders   Tdap vaccine greater than or equal to 7yo IM (Completed)      Note: This dictation was prepared with Dragon dictation along with smaller phrase technology. Any transcriptional errors that result from this process are unintentional.

## 2018-07-22 ENCOUNTER — Encounter: Payer: Self-pay | Admitting: Family Medicine

## 2018-07-22 LAB — LIPID PANEL
Cholesterol: 195 mg/dL (ref <200)
HDL: 51 mg/dL (ref >=50)
LDL Cholesterol (Calc): 126 mg/dL (calc) — ABNORMAL HIGH
Non-HDL Cholesterol (Calc): 144 mg/dL (calc) — ABNORMAL HIGH (ref <130)
Total CHOL/HDL Ratio: 3.8 (calc) (ref <5.0)
Triglycerides: 83 mg/dL (ref <150)

## 2018-07-22 LAB — CBC WITH DIFFERENTIAL/PLATELET
Absolute Monocytes: 388 cells/uL (ref 200–950)
Basophils Absolute: 17 cells/uL (ref 0–200)
Basophils Relative: 0.3 %
Eosinophils Absolute: 239 cells/uL (ref 15–500)
Eosinophils Relative: 4.2 %
HCT: 41.8 % (ref 35.0–45.0)
Hemoglobin: 14 g/dL (ref 11.7–15.5)
Lymphs Abs: 2223 cells/uL (ref 850–3900)
MCH: 31.8 pg (ref 27.0–33.0)
MCHC: 33.5 g/dL (ref 32.0–36.0)
MCV: 95 fL (ref 80.0–100.0)
MPV: 10.8 fL (ref 7.5–12.5)
Monocytes Relative: 6.8 %
Neutro Abs: 2833 cells/uL (ref 1500–7800)
Neutrophils Relative %: 49.7 %
Platelets: 231 10*3/uL (ref 140–400)
RBC: 4.4 10*6/uL (ref 3.80–5.10)
RDW: 11.8 % (ref 11.0–15.0)
Total Lymphocyte: 39 %
WBC: 5.7 10*3/uL (ref 3.8–10.8)

## 2018-07-22 LAB — COMPREHENSIVE METABOLIC PANEL
AG Ratio: 2 (calc) (ref 1.0–2.5)
ALT: 14 U/L (ref 6–29)
AST: 17 U/L (ref 10–35)
Albumin: 4.3 g/dL (ref 3.6–5.1)
Alkaline phosphatase (APISO): 62 U/L (ref 37–153)
BUN: 14 mg/dL (ref 7–25)
CO2: 26 mmol/L (ref 20–32)
Calcium: 9.5 mg/dL (ref 8.6–10.4)
Chloride: 107 mmol/L (ref 98–110)
Creat: 0.85 mg/dL (ref 0.50–1.05)
Globulin: 2.2 g/dL (calc) (ref 1.9–3.7)
Glucose, Bld: 83 mg/dL (ref 65–99)
Potassium: 4.4 mmol/L (ref 3.5–5.3)
Sodium: 142 mmol/L (ref 135–146)
Total Bilirubin: 0.7 mg/dL (ref 0.2–1.2)
Total Protein: 6.5 g/dL (ref 6.1–8.1)

## 2018-07-22 LAB — HEMOGLOBIN A1C
Hgb A1c MFr Bld: 5.2 % of total Hgb (ref <5.7)
Mean Plasma Glucose: 103 (calc)
eAG (mmol/L): 5.7 (calc)

## 2018-07-22 LAB — TSH: TSH: 1.55 mIU/L

## 2018-07-22 LAB — VITAMIN D 25 HYDROXY (VIT D DEFICIENCY, FRACTURES): Vit D, 25-Hydroxy: 49 ng/mL (ref 30–100)

## 2018-07-22 NOTE — Assessment & Plan Note (Signed)
Doing well on celexa no changes

## 2018-10-12 ENCOUNTER — Other Ambulatory Visit (HOSPITAL_COMMUNITY): Payer: Self-pay | Admitting: Family Medicine

## 2018-10-12 DIAGNOSIS — Z1231 Encounter for screening mammogram for malignant neoplasm of breast: Secondary | ICD-10-CM

## 2018-10-22 ENCOUNTER — Other Ambulatory Visit: Payer: Self-pay

## 2018-10-22 ENCOUNTER — Ambulatory Visit (HOSPITAL_COMMUNITY)
Admission: RE | Admit: 2018-10-22 | Discharge: 2018-10-22 | Disposition: A | Discharge status: Home / Self Care | Payer: 59 | Source: Ambulatory Visit | Attending: Family Medicine | Admitting: Family Medicine

## 2018-10-22 DIAGNOSIS — Z20822 Contact with and (suspected) exposure to covid-19: Secondary | ICD-10-CM

## 2018-10-22 DIAGNOSIS — R6889 Other general symptoms and signs: Secondary | ICD-10-CM | POA: Diagnosis not present

## 2018-10-22 DIAGNOSIS — Z1231 Encounter for screening mammogram for malignant neoplasm of breast: Secondary | ICD-10-CM | POA: Diagnosis not present

## 2018-10-22 IMAGING — MG DIGITAL SCREENING BILATERAL MAMMOGRAM WITH TOMO AND CAD
8 series · 9 of 24 positions shown · non-contrast
Comparison: Previous exam(s).

CLINICAL DATA: Screening.

EXAM:
DIGITAL SCREENING BILATERAL MAMMOGRAM WITH TOMO AND CAD

[L CC synth-2D]
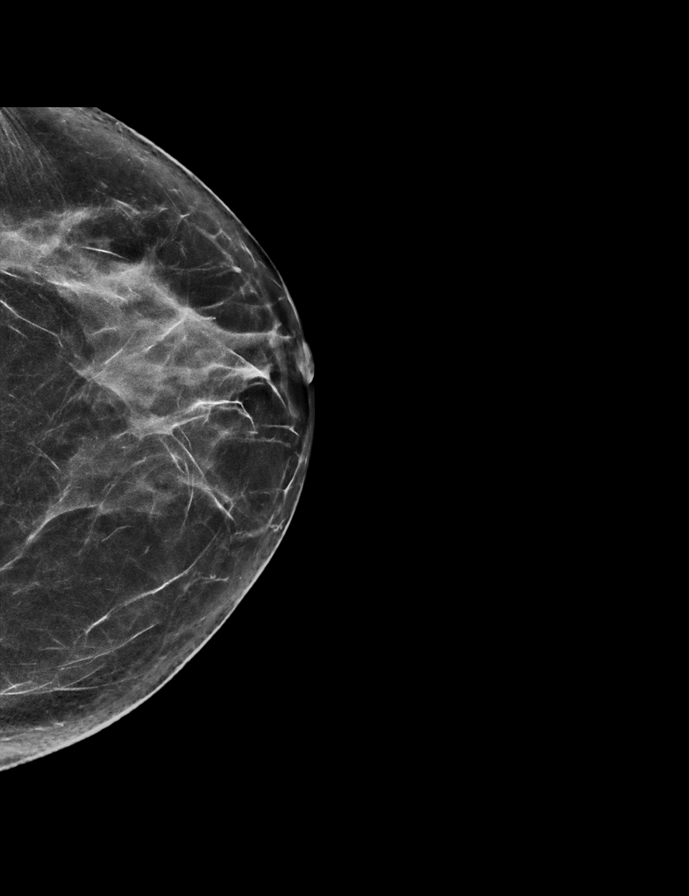

[R CC synth-2D]
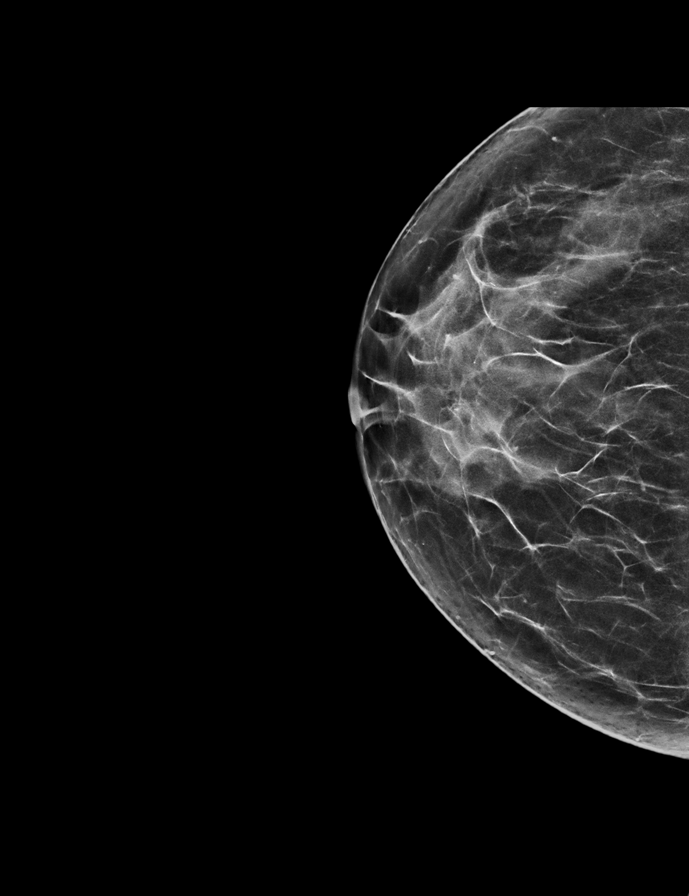

[R MLO synth-2D]
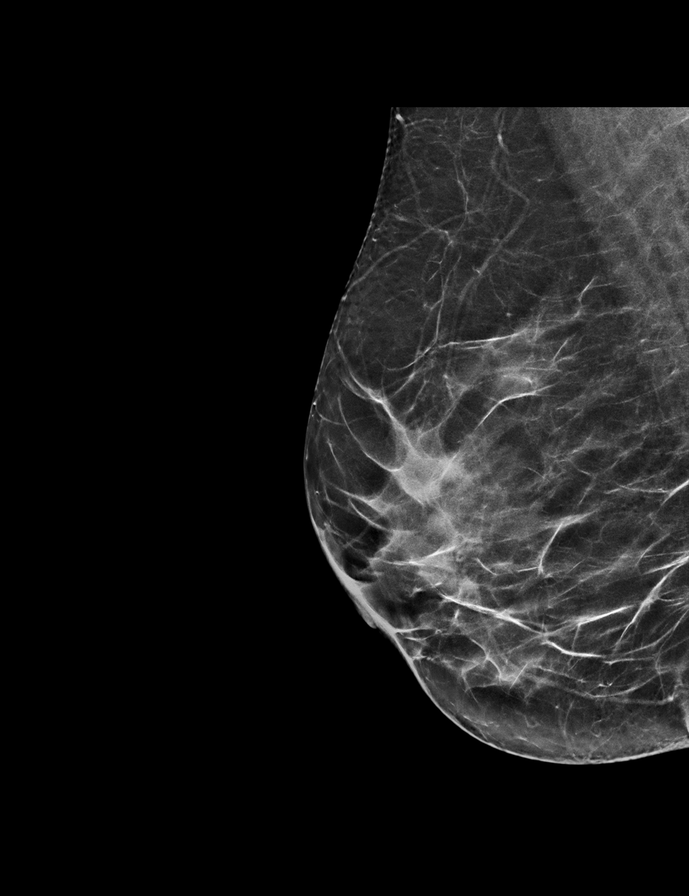

[L MLO synth-2D]
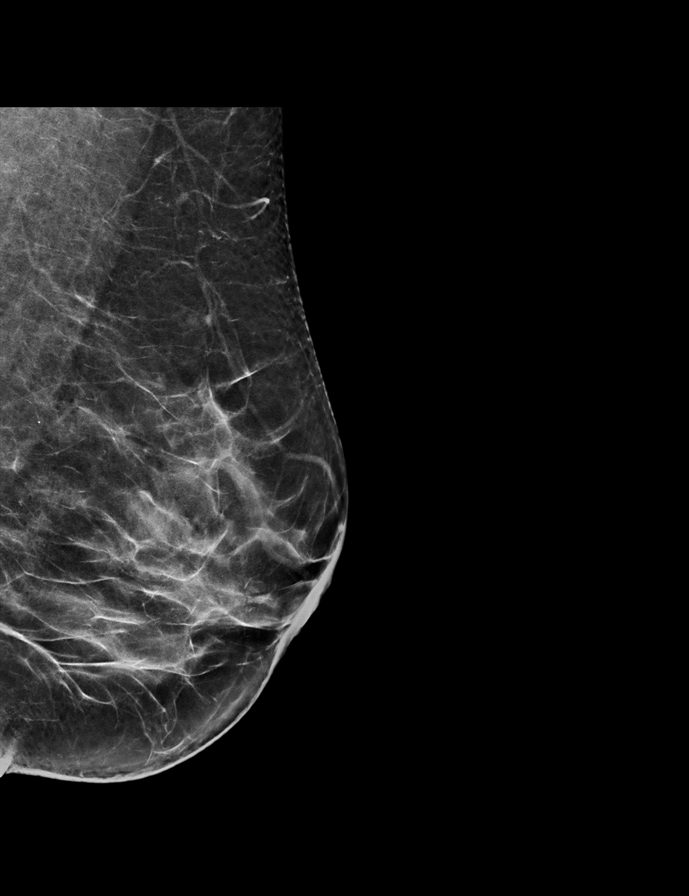

[R CC tomo · 2 of 60 frames shown]
[frame 20/60]
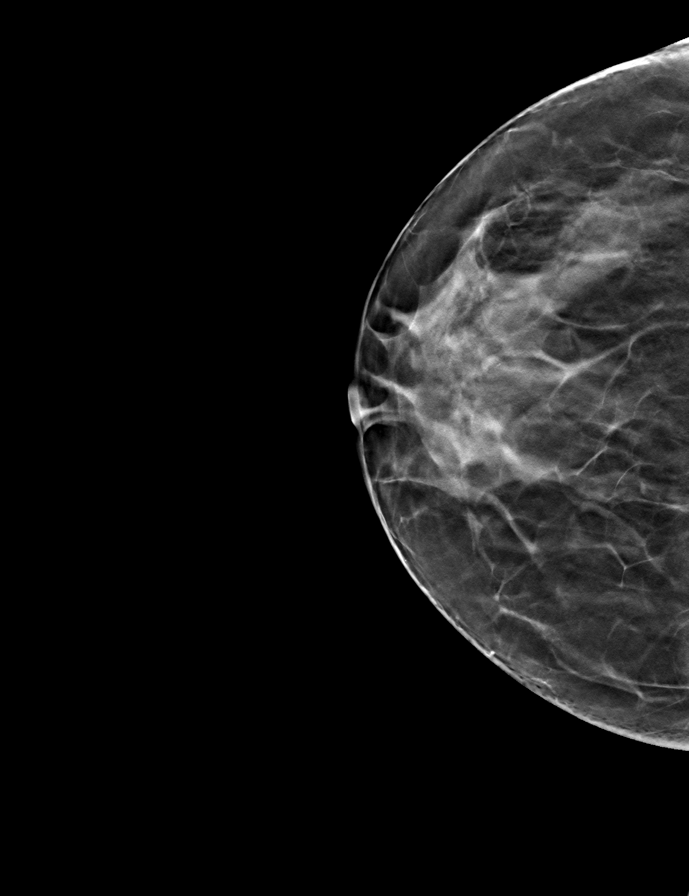
[frame 31/60]
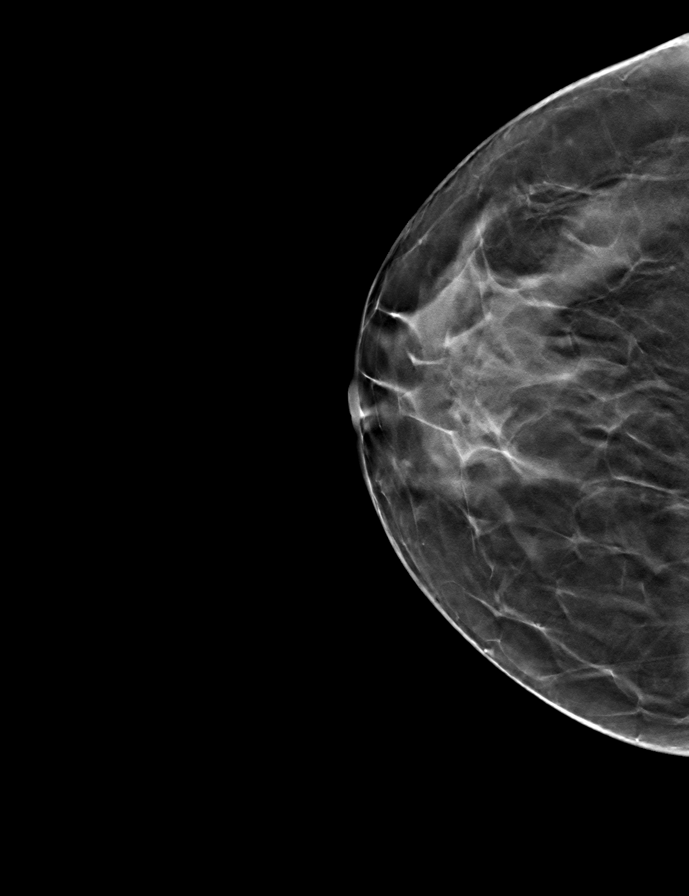

[L CC tomo · tomo slice 31/62.0]
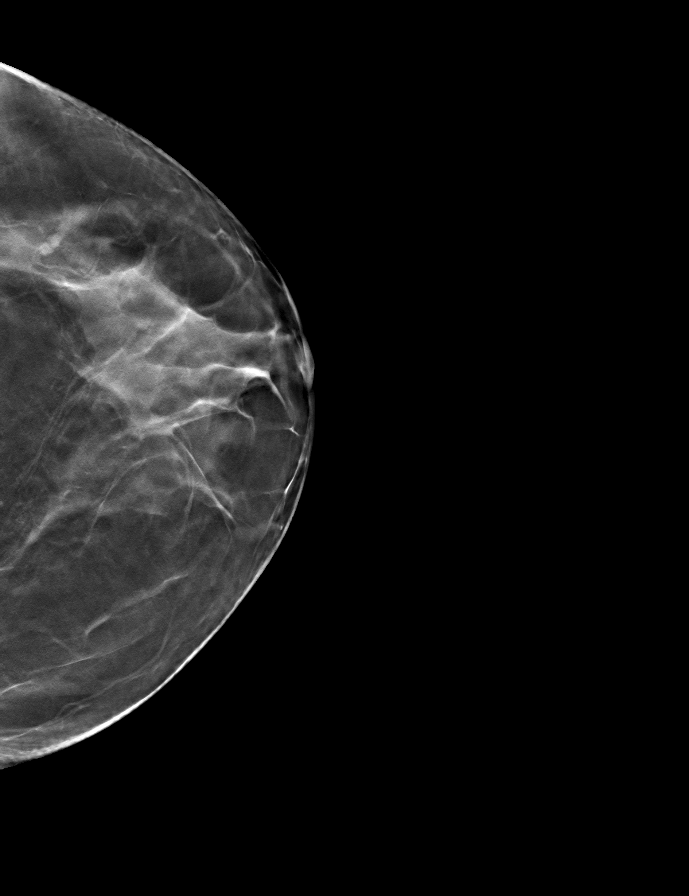

[L MLO tomo · tomo slice 33/64.0]
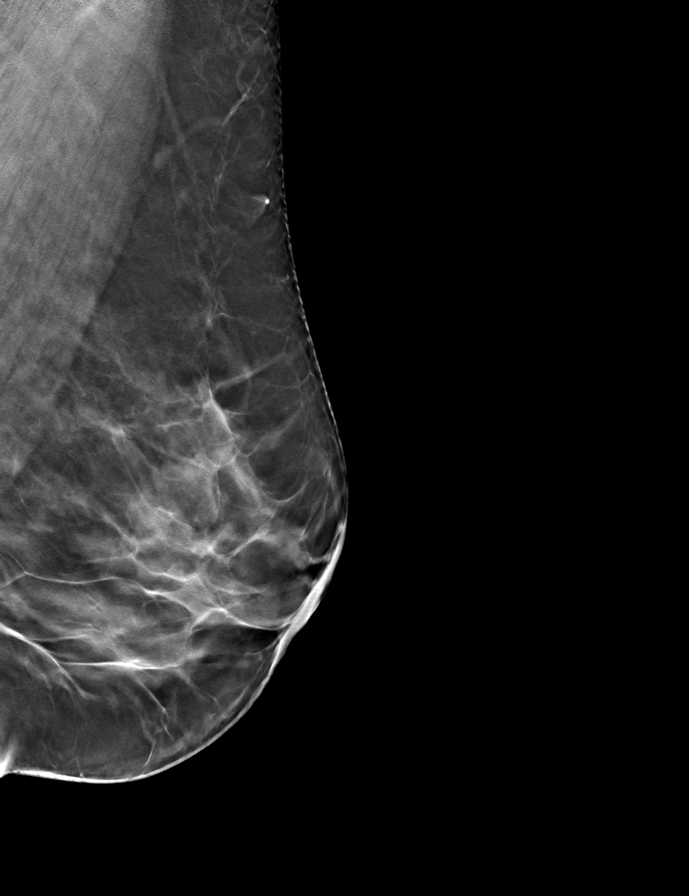

[R MLO tomo · tomo slice 31/61.0]
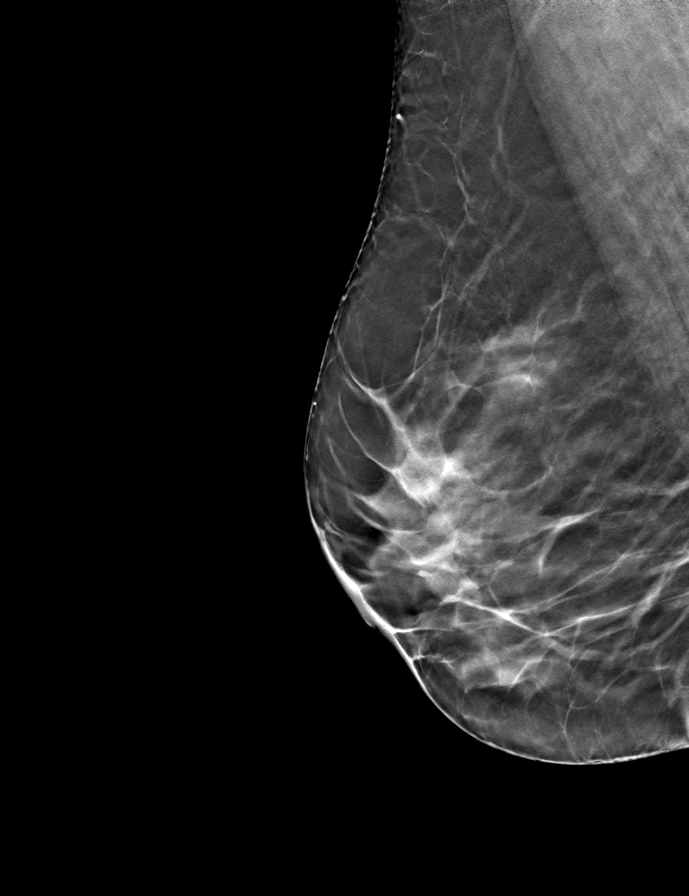

[9 of 24 positions shown; findings below may reference images not displayed]

ACR Breast Density Category c: The breast tissue is heterogeneously
dense, which may obscure small masses.
FINDINGS: There are no findings suspicious for malignancy. Images were
processed with CAD.
IMPRESSION: No mammographic evidence of malignancy. A result letter of this
screening mammogram will be mailed directly to the patient.

RECOMMENDATION:
Screening mammogram in one year. (Code:[5V])

BI-RADS CATEGORY  1: Negative.

## 2018-10-23 LAB — NOVEL CORONAVIRUS, NAA: SARS-CoV-2, NAA: NOT DETECTED

## 2018-12-02 ENCOUNTER — Other Ambulatory Visit: Payer: Self-pay | Admitting: Family Medicine

## 2018-12-02 MED FILL — CITALOPRAM HBR 40 MG TABLET: 40 | 90 days supply | Qty: 90 | Fill #0

## 2019-03-21 ENCOUNTER — Other Ambulatory Visit: Payer: Self-pay | Admitting: Family Medicine

## 2019-03-21 MED FILL — CITALOPRAM HBR 40 MG TABLET: 40 | 90 days supply | Qty: 90 | Fill #0

## 2019-04-18 ENCOUNTER — Encounter: Payer: Self-pay | Admitting: Family Medicine

## 2019-05-25 ENCOUNTER — Encounter: Payer: Self-pay | Admitting: Family Medicine

## 2019-06-06 ENCOUNTER — Ambulatory Visit: Payer: 59 | Admitting: Family Medicine

## 2019-06-14 ENCOUNTER — Encounter: Payer: Self-pay | Admitting: Family Medicine

## 2019-06-14 ENCOUNTER — Ambulatory Visit (INDEPENDENT_AMBULATORY_CARE_PROVIDER_SITE_OTHER): Payer: No Typology Code available for payment source | Admitting: Family Medicine

## 2019-06-14 ENCOUNTER — Other Ambulatory Visit: Payer: Self-pay

## 2019-06-14 VITALS — BP 118/72 | HR 61 | Temp 97.8°F | Ht 60.0 in | Wt 150.5 lb

## 2019-06-14 DIAGNOSIS — F411 Generalized anxiety disorder: Secondary | ICD-10-CM | POA: Diagnosis not present

## 2019-06-14 DIAGNOSIS — F331 Major depressive disorder, recurrent, moderate: Secondary | ICD-10-CM

## 2019-06-14 MED ORDER — CITALOPRAM HYDROBROMIDE 40 MG PO TABS: 40.0000 mg | ORAL_TABLET | Freq: Every day | ORAL | 1 refills | Status: DC

## 2019-06-14 MED ORDER — BUPROPION HCL ER (XL) 150 MG PO TB24: 150.0000 mg | ORAL_TABLET | Freq: Every day | ORAL | 1 refills | Status: DC

## 2019-06-14 MED FILL — buPROPion HCL ER (XL) 150 M: 150 | 30 days supply | Qty: 30 | Fill #0

## 2019-06-14 MED FILL — CITALOPRAM HBR 40 MG TABLET: 40 | 90 days supply | Qty: 90 | Fill #0

## 2019-06-14 NOTE — Patient Instructions (Addendum)
Wellbutrin once a day in the morning F/U as previous

## 2019-06-14 NOTE — Progress Notes (Signed)
Subjective:    Patient ID: Shelley Macias, female    DOB: [birth year], 54 y.o.   MRN: [id number]  Patient presents for Depression   Pt here to f/u medications  She has been taking Celexa for 5 years now.  Sometimes she feels quite well on the medication however the past few months have been very difficult.  The setting of pandemic working marital issues and taking care of her parents ( Mother had dementia, recent stroke)  she is now more depressed and mentally fatigued.  She has been through marriage counseling with her husband but he is very reluctant to change any of his behaviors.  She is even contemplating separation.  She feels overwhelmed with the day today as well as helping with her parents.  Just does not have energy to do things around the house or things that she enjoys.  She does go to her beach house on the weekends and that is quite relaxing for her.  She spoke with her sister because her naturopathic doctor and decided to try some extra supplements to help boost her mood.  She is taking multivitamin B12 magnesium oxide B6.  She is considering starting Ashwaganda as well. Her sleep is  good when she is able to fall asleep.  She admits that she has been craving sweets and salty foods just for stress   Review Of Systems:   GEN- denies fatigue, fever, weight loss,weakness, recent illness HEENT- denies eye drainage, change in vision, nasal discharge, CVS- denies chest pain, palpitations RESP- denies SOB, cough, wheeze ABD- denies N/V, change in stools, abd pain GU- denies dysuria, hematuria, dribbling, incontinence MSK- denies joint pain, muscle aches, injury Neuro- denies headache, dizziness, syncope, seizure activity       Objective:    BP 118/72   Pulse 61   Temp 97.8 F (36.6 C) (Temporal)   Ht 5' (1.524 m)   Wt 150 lb 8 oz (68.3 kg)   SpO2 96%   BMI 29.39 kg/m  GEN- NAD, alert and oriented x3 HEENT- PERRL, EOMI, non injected sclera, pink conjunctiva, MMM,  oropharynx clear Neck- Supple, no thyromegaly CVS- RRR, no murmur RESP-CTAB Psych- depressed affect, not anxious, no SI , well groomed, good eye contact  EXT- No edema Pulses- Radial  2+        Assessment & Plan:      Problem List Items Addressed This Visit      Unprioritized   GAD (generalized anxiety disorder) - Primary   Relevant Medications   buPROPion (WELLBUTRIN XL) 150 MG 24 hr tablet   citalopram (CELEXA) 40 MG tablet   MDD (major depressive disorder)    Major depressive disorder along with history of anxiety.  She has been on Celexa for quite some time a little leery about tapering her off this medication will worsen her mood even if you try to switch to something different.  We will try adding Wellbutrin 150 mg standard release once a day as an adjunct to the Celexa for now.  There is no interaction with the Ashwaganda that I can find with her meds.  she will consider starting a low-dose of this the other supplements are fine for her to take.  She is  also going to to start psychotherapy next week.  F/U in 1 month for meds and CPE       Relevant Medications   buPROPion (WELLBUTRIN XL) 150 MG 24 hr tablet   citalopram (CELEXA) 40 MG tablet  Note: This dictation was prepared with Dragon dictation along with smaller phrase technology. Any transcriptional errors that result from this process are unintentional.

## 2019-06-14 NOTE — Assessment & Plan Note (Addendum)
Major depressive disorder along with history of anxiety.  She has been on Celexa for quite some time a little leery about tapering her off this medication will worsen her mood even if you try to switch to something different.  We will try adding Wellbutrin 150 mg standard release once a day as an adjunct to the Celexa for now.  There is no interaction with the Ashwaganda that I can find with her meds.  she will consider starting a low-dose of this the other supplements are fine for her to take.  She is  also going to to start psychotherapy next week.  F/U in 1 month for meds and CPE

## 2019-07-20 ENCOUNTER — Ambulatory Visit (INDEPENDENT_AMBULATORY_CARE_PROVIDER_SITE_OTHER): Payer: No Typology Code available for payment source | Admitting: Family Medicine

## 2019-07-20 ENCOUNTER — Other Ambulatory Visit: Payer: Self-pay

## 2019-07-20 ENCOUNTER — Other Ambulatory Visit: Payer: Self-pay | Admitting: Family Medicine

## 2019-07-20 ENCOUNTER — Encounter: Payer: Self-pay | Admitting: Family Medicine

## 2019-07-20 VITALS — BP 122/70 | HR 62 | Temp 97.9°F | Resp 14 | Ht 60.0 in | Wt 149.0 lb

## 2019-07-20 DIAGNOSIS — Z0001 Encounter for general adult medical examination with abnormal findings: Secondary | ICD-10-CM

## 2019-07-20 DIAGNOSIS — F411 Generalized anxiety disorder: Secondary | ICD-10-CM

## 2019-07-20 DIAGNOSIS — F331 Major depressive disorder, recurrent, moderate: Secondary | ICD-10-CM | POA: Diagnosis not present

## 2019-07-20 DIAGNOSIS — E663 Overweight: Secondary | ICD-10-CM

## 2019-07-20 DIAGNOSIS — Z0000 Encounter for general adult medical examination without abnormal findings: Secondary | ICD-10-CM

## 2019-07-20 MED ORDER — BUPROPION HCL ER (XL) 150 MG PO TB24: 150.0000 mg | ORAL_TABLET | Freq: Every day | ORAL | 2 refills | Status: DC

## 2019-07-20 MED FILL — buPROPion HCL ER (XL) 150 M: 150 | 90 days supply | Qty: 90 | Fill #0

## 2019-07-20 NOTE — Patient Instructions (Signed)
We will call with lab results F/U 3 months for medications

## 2019-07-20 NOTE — Assessment & Plan Note (Signed)
Continue current regimen Wellbutrin and celexa No change to dose F/u 3 months Continue therapy as scheduled

## 2019-07-20 NOTE — Assessment & Plan Note (Signed)
Weight down 1.5lbs She has cut out sugar beverages

## 2019-07-20 NOTE — Progress Notes (Signed)
Subjective:    Patient ID: Shelley Macias, female    DOB: Jun 27, 1965, 54 y.o.   MRN: [id number]  Patient presents for Annual Exam (is fasting)  Patient here for complete physical exam and follow-up medications.   Last visit 1 month ago we discussed her Depression/GAD.  SHe had been on celexa for many years and in the setting of Pandemic, work, marital issues, taking care of her parents stressors and symptoms worsened.  I added Wellbutrin 150 mg extended release.  He is also taking Ashwaganda and she was starting psychotherapy. Today she states she has noticed an improvement in her symptoms. Not as stressed, and doesn't feel on edge all the time. She has also been working on her nutrition  She is off diet mountain dew, drinking more water/flavored water  Her mother had a stroke recently, so that has been a change, but with the meds and therapy she has been handling things as best as she can No SE with the medications   Immunizations UTD including COVID-19 Colonoscopy UTD  2016    Mammogram UTD- due again in August 2021   PAP Smear UTD    Due for fasting labs , 1 year ago, mildly elevated LDL at 126, otherwise normal labs    Taking some vitamins , including calcium   She follows with dentist and eye doctor  Review Of Systems:  GEN- denies fatigue, fever, weight loss,weakness, recent illness HEENT- denies eye drainage, change in vision, nasal discharge, CVS- denies chest pain, palpitations RESP- denies SOB, cough, wheeze ABD- denies N/V, change in stools, abd pain GU- denies dysuria, hematuria, dribbling, incontinence MSK- denies joint pain, muscle aches, injury Neuro- denies headache, dizziness, syncope, seizure activity       Objective:    BP 122/70   Pulse 62   Temp 97.9 F (36.6 C) (Temporal)   Resp 14   Ht 5' (1.524 m)   Wt 149 lb (67.6 kg)   SpO2 97%   BMI 29.10 kg/m  GEN- NAD, alert and oriented x3 HEENT- PERRL, EOMI, non injected sclera, pink conjunctiva,  MMM, oropharynx clear Neck- Supple, no thyromegaly, no bruit  CVS- RRR, no murmur RESP-CTAB ABD-NABS,soft,NT,ND Psych- normal affect and mood  EXT- No edema Pulses- Radial, DP- 2+   PHQ9 - score 0 today, previous 11 in April      Assessment & Plan:      Problem List Items Addressed This Visit      Unprioritized   GAD (generalized anxiety disorder)   Relevant Medications   buPROPion (WELLBUTRIN XL) 150 MG 24 hr tablet   MDD (major depressive disorder)    Continue current regimen Wellbutrin and celexa No change to dose F/u 3 months Continue therapy as scheduled       Relevant Medications   buPROPion (WELLBUTRIN XL) 150 MG 24 hr tablet   Overweight (BMI 25.0-29.9)    Weight down 1.5lbs She has cut out sugar beverages        Other Visit Diagnoses    Routine general medical examination at a health care facility    -  Primary   CPE done, prevention UTD, fasting labs obtained    Relevant Orders   CBC with Differential/Platelet   Comprehensive metabolic panel   Lipid panel   TSH   Vitamin D, 25-hydroxy   Hemoglobin A1c      Note: This dictation was prepared with Dragon dictation along with smaller phrase technology. Any transcriptional errors that result from this process  are unintentional.

## 2019-07-21 LAB — COMPREHENSIVE METABOLIC PANEL
AG Ratio: 2.1 (calc) (ref 1.0–2.5)
ALT: 15 U/L (ref 6–29)
AST: 14 U/L (ref 10–35)
Albumin: 4.5 g/dL (ref 3.6–5.1)
Alkaline phosphatase (APISO): 67 U/L (ref 37–153)
BUN: 15 mg/dL (ref 7–25)
CO2: 28 mmol/L (ref 20–32)
Calcium: 9.2 mg/dL (ref 8.6–10.4)
Chloride: 105 mmol/L (ref 98–110)
Creat: 0.86 mg/dL (ref 0.50–1.05)
Globulin: 2.1 g/dL (calc) (ref 1.9–3.7)
Glucose, Bld: 83 mg/dL (ref 65–99)
Potassium: 4.7 mmol/L (ref 3.5–5.3)
Sodium: 140 mmol/L (ref 135–146)
Total Bilirubin: 0.7 mg/dL (ref 0.2–1.2)
Total Protein: 6.6 g/dL (ref 6.1–8.1)

## 2019-07-21 LAB — LIPID PANEL
Cholesterol: 200 mg/dL — ABNORMAL HIGH (ref <200)
HDL: 53 mg/dL (ref >=50)
LDL Cholesterol (Calc): 124 mg/dL (calc) — ABNORMAL HIGH
Non-HDL Cholesterol (Calc): 147 mg/dL (calc) — ABNORMAL HIGH (ref <130)
Total CHOL/HDL Ratio: 3.8 (calc) (ref <5.0)
Triglycerides: 124 mg/dL (ref <150)

## 2019-07-21 LAB — HEMOGLOBIN A1C
Hgb A1c MFr Bld: 5.2 % of total Hgb (ref <5.7)
Mean Plasma Glucose: 103 (calc)
eAG (mmol/L): 5.7 (calc)

## 2019-07-21 LAB — CBC WITH DIFFERENTIAL/PLATELET
Absolute Monocytes: 378 cells/uL (ref 200–950)
Basophils Absolute: 19 cells/uL (ref 0–200)
Basophils Relative: 0.3 %
Eosinophils Absolute: 176 cells/uL (ref 15–500)
Eosinophils Relative: 2.8 %
HCT: 43.5 % (ref 35.0–45.0)
Hemoglobin: 14.4 g/dL (ref 11.7–15.5)
Lymphs Abs: 1770 cells/uL (ref 850–3900)
MCH: 31.9 pg (ref 27.0–33.0)
MCHC: 33.1 g/dL (ref 32.0–36.0)
MCV: 96.5 fL (ref 80.0–100.0)
MPV: 10.2 fL (ref 7.5–12.5)
Monocytes Relative: 6 %
Neutro Abs: 3956 cells/uL (ref 1500–7800)
Neutrophils Relative %: 62.8 %
Platelets: 243 10*3/uL (ref 140–400)
RBC: 4.51 10*6/uL (ref 3.80–5.10)
RDW: 12.1 % (ref 11.0–15.0)
Total Lymphocyte: 28.1 %
WBC: 6.3 10*3/uL (ref 3.8–10.8)

## 2019-07-21 LAB — VITAMIN D 25 HYDROXY (VIT D DEFICIENCY, FRACTURES): Vit D, 25-Hydroxy: 26 ng/mL — ABNORMAL LOW (ref 30–100)

## 2019-07-21 LAB — TSH: TSH: 2.07 mIU/L

## 2019-10-11 MED FILL — CITALOPRAM HBR 40 MG TABLET: 40 | 90 days supply | Qty: 90 | Fill #1

## 2019-10-11 MED FILL — buPROPion HCL ER (XL) 150 M: 150 | 90 days supply | Qty: 90 | Fill #1

## 2019-11-03 ENCOUNTER — Ambulatory Visit (INDEPENDENT_AMBULATORY_CARE_PROVIDER_SITE_OTHER): Payer: No Typology Code available for payment source | Admitting: Nurse Practitioner

## 2019-11-03 ENCOUNTER — Other Ambulatory Visit: Payer: Self-pay

## 2019-11-03 DIAGNOSIS — M25562 Pain in left knee: Secondary | ICD-10-CM

## 2019-11-03 DIAGNOSIS — M542 Cervicalgia: Secondary | ICD-10-CM
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
## 2019-12-08 DIAGNOSIS — M8580 Other specified disorders of bone density and structure, unspecified site: Secondary | ICD-10-CM | POA: Insufficient documentation

## 2020-01-12 LAB — HM DEXA SCAN

## 2020-02-02 ENCOUNTER — Other Ambulatory Visit: Payer: Self-pay | Admitting: Family Medicine

## 2020-02-02 MED FILL — CITALOPRAM HBR 40 MG TABLET: 40 | 90 days supply | Qty: 90 | Fill #0

## 2020-04-03 ENCOUNTER — Other Ambulatory Visit: Payer: Self-pay

## 2020-04-07 ENCOUNTER — Encounter: Payer: Self-pay | Admitting: Family Medicine

## 2020-04-09 MED ORDER — VITAMIN D (ERGOCALCIFEROL) 1.25 MG (50000 UNIT) PO CAPS: 50000.0000 [IU] | ORAL_CAPSULE | ORAL | 0 refills | Status: DC

## 2020-04-09 NOTE — Telephone Encounter (Signed)
Vit D sent to pharmacy.

## 2020-04-13 MED FILL — buPROPion HCL ER (XL) 150 M: 150 | 90 days supply | Qty: 90 | Fill #2

## 2020-04-13 MED FILL — CITALOPRAM HBR 40 MG TABLET: 40 | 90 days supply | Qty: 90 | Fill #1

## 2020-06-05 ENCOUNTER — Other Ambulatory Visit (HOSPITAL_COMMUNITY): Payer: Self-pay

## 2020-07-12 ENCOUNTER — Other Ambulatory Visit: Payer: Self-pay | Admitting: Family Medicine

## 2020-07-12 ENCOUNTER — Other Ambulatory Visit (HOSPITAL_COMMUNITY): Payer: Self-pay

## 2020-07-12 MED ORDER — BUPROPION HCL ER (XL) 150 MG PO TB24
150.0000 mg | ORAL_TABLET | Freq: Every day | ORAL | 2 refills | Status: DC
  Filled 2020-07-12: qty 90, 90d supply, fill #0
  Filled 2020-12-12: qty 90, 90d supply, fill #1
  Filled 2021-03-26: qty 90, 90d supply, fill #2

## 2020-07-12 MED ORDER — CITALOPRAM HYDROBROMIDE 40 MG PO TABS
40.0000 mg | ORAL_TABLET | Freq: Every day | ORAL | 1 refills | Status: DC
  Filled 2020-07-12: qty 90, 90d supply, fill #0
  Filled 2020-12-12: qty 90, 90d supply, fill #1

## 2020-07-13 ENCOUNTER — Ambulatory Visit (INDEPENDENT_AMBULATORY_CARE_PROVIDER_SITE_OTHER): Payer: 59 | Admitting: Nurse Practitioner

## 2020-07-13 ENCOUNTER — Other Ambulatory Visit: Payer: Self-pay

## 2020-07-13 ENCOUNTER — Other Ambulatory Visit (HOSPITAL_COMMUNITY): Payer: Self-pay

## 2020-07-13 ENCOUNTER — Encounter: Payer: Self-pay | Admitting: Nurse Practitioner

## 2020-07-13 VITALS — BP 102/62 | HR 97 | Temp 97.7°F | Ht 60.0 in | Wt 138.8 lb

## 2020-07-13 DIAGNOSIS — Z13220 Encounter for screening for lipoid disorders: Secondary | ICD-10-CM | POA: Diagnosis not present

## 2020-07-13 DIAGNOSIS — F331 Major depressive disorder, recurrent, moderate: Secondary | ICD-10-CM

## 2020-07-13 DIAGNOSIS — F902 Attention-deficit hyperactivity disorder, combined type: Secondary | ICD-10-CM

## 2020-07-13 DIAGNOSIS — Z136 Encounter for screening for cardiovascular disorders: Secondary | ICD-10-CM | POA: Diagnosis not present

## 2020-07-13 DIAGNOSIS — Z0000 Encounter for general adult medical examination without abnormal findings: Secondary | ICD-10-CM | POA: Diagnosis not present

## 2020-07-13 DIAGNOSIS — F411 Generalized anxiety disorder: Secondary | ICD-10-CM | POA: Diagnosis not present

## 2020-07-13 DIAGNOSIS — E663 Overweight: Secondary | ICD-10-CM

## 2020-07-13 DIAGNOSIS — E559 Vitamin D deficiency, unspecified: Secondary | ICD-10-CM

## 2020-07-13 DIAGNOSIS — Z131 Encounter for screening for diabetes mellitus: Secondary | ICD-10-CM | POA: Diagnosis not present

## 2020-07-13 NOTE — Assessment & Plan Note (Signed)
Chronic.  Stable on Celexa 40 mg daily.  PHQ-9 and GAD-7 0 today.  No SI/HI today.  Continue Celexa 40 mg daily for now.  Refills given. Follow up in 6 months - 1 year.

## 2020-07-13 NOTE — Progress Notes (Signed)
BP 102/62   Pulse 97   Temp 97.7 F (36.5 C)   Ht 5' (1.524 m)   Wt 138 lb 12.8 oz (63 kg)   SpO2 97%   BMI 27.11 kg/m    Subjective:    Patient ID: Shelley Macias, female    DOB: 1966/01/16, 55 y.o.   MRN: [id number]  HPI: Shelley Macias is a 55 y.o. female presenting on 07/13/2020 for comprehensive medical examination. Current medical complaints include:  ADHD - taking Wellbutrin XL 150 mg daily and this helps with her symptoms.    Generalized anxiety disorder and major depressive disorder - well controlled with Celexa 40 mg daily.    Has been trying to lose weight with good success - following [place].   Physical activity - stays active with walking, working in garden; does not do much weight bearing.  Reports her cousin who is 41 year older than she is was diagnosed with stage IV lung cancer at the end of last year.  She has found very high levels of radon in her basement.  Patient is wondering if there is screening for lung cancer that can be done.  She is having her basement test for radon.  She currently lives with: husband, 1 son (11) LMP: 6-7 years ago; no menopausal symptoms  Depression Screen done today and results listed below:  Depression screen [facility] 2/9 07/13/2020 07/20/2019 06/14/2019 07/21/2018 04/10/2017  Decreased Interest 0 0 2 0 2  Down, Depressed, Hopeless 0 0 3 0 2  PHQ - 2 Score 0 0 5 0 4  Altered sleeping 0 0 1 - 2  Tired, decreased energy 0 0 3 - 2  Change in appetite 0 0 2 - 2  Feeling bad or failure about yourself  0 0 0 - 0  Trouble concentrating 0 0 0 - 2  Moving slowly or fidgety/restless 0 0 0 - 0  Suicidal thoughts 0 0 0 - 0  PHQ-9 Score 0 0 11 - 12  Difficult doing work/chores Not difficult at all Not difficult at all Somewhat difficult - Somewhat difficult   GAD 7 : Generalized Anxiety Score 07/13/2020  Nervous, Anxious, on Edge 0  Control/stop worrying 0  Worry too much - different things 0  Trouble relaxing 0  Restless 0  Easily  annoyed or irritable 0  Afraid - awful might happen 0  Total GAD 7 Score 0  Anxiety Difficulty Not difficult at all    Past Medical History:  Past Medical History:  Diagnosis Date  . Allergy   . Anxiety   . Depression    Phreesia 07/18/2019  . Esophageal ulcer   . GERD (gastroesophageal reflux disease)   . Seasonal allergies   . Urinary tract infection     Surgical History:  Past Surgical History:  Procedure Laterality Date  . APPENDECTOMY    . [place]  . CESAREAN SECTION N/A    Phreesia 07/18/2019  . COLONOSCOPY N/A 10/25/2014   Procedure: COLONOSCOPY;  Surgeon: Rogene Houston, MD;  [facility];  Service: Endoscopy;  Laterality: N/A;  1030 -- cancel order for TCS  . ESOPHAGOGASTRODUODENOSCOPY    . ESOPHAGOGASTRODUODENOSCOPY N/A 10/25/2014   Procedure: ESOPHAGOGASTRODUODENOSCOPY (EGD);  Surgeon: Rogene Houston, MD;  [facility];  Service: Endoscopy;  Laterality: N/A;    Medications:  Current Outpatient Medications on File Prior to Visit  Medication Sig  . b complex vitamins tablet Take 1 tablet  by mouth daily.  Marland Kitchen buPROPion (WELLBUTRIN XL) 150 MG 24 hr tablet Take 1 tablet (150 mg total) by mouth daily.  . calcium-vitamin D (OSCAL WITH D) 250-125 MG-UNIT tablet Take 1 tablet by mouth daily.  . citalopram (CELEXA) 40 MG tablet Take 1 tablet (40 mg total) by mouth daily.  . magnesium oxide (MAG-OX) 400 MG tablet Take 400 mg by mouth daily.  . Multiple Vitamin (MULTIVITAMIN WITH MINERALS) TABS tablet Take by mouth daily.  . vitamin B-12 (CYANOCOBALAMIN) 500 MCG tablet Take 500 mcg by mouth daily.  . Vitamin D, Ergocalciferol, (DRISDOL) 1.25 MG (50000 UNIT) CAPS capsule Take 1 capsule (50,000 Units total) by mouth every 7 (seven) days. x12 weeks.   No current facility-administered medications on file prior to visit.    Allergies:  Allergies  Allergen Reactions  . Demerol [Meperidine]   . Other     CATS  . Penicillins      Social History:  Social History   Socioeconomic History  . Marital status: Married    Spouse name: Not on file  . Number of children: Not on file  . Years of education: Not on file  . Highest education level: Not on file  Occupational History  . Not on file  Tobacco Use  . Smoking status: Never Smoker  . Smokeless tobacco: Never Used  Substance and Sexual Activity  . Alcohol use: No  . Drug use: No  . Sexual activity: Yes    Birth control/protection: Surgical  Other Topics Concern  . Not on file  Social History Narrative  . Not on file   Social Determinants of Health   Financial Resource Strain: Not on file  Food Insecurity: Not on file  Transportation Needs: Not on file  Physical Activity: Not on file  Stress: Not on file  Social Connections: Not on file  Intimate Partner Violence: Not on file   Social History   Tobacco Use  Smoking Status Never Smoker  Smokeless Tobacco Never Used   Social History   Substance and Sexual Activity  Alcohol Use No    Family History:  Family History  Problem Relation Age of Onset  . Hearing loss Mother   . Dementia Mother   . Stroke Mother   . Hypertension Mother   . Heart disease Father        A- fib  . Hyperlipidemia Father   . Hypertension Father   . Diabetes Father   . Cancer Father        Basal cell  . Mental illness Sister   . Learning disabilities Sister   . Diabetes Brother   . Hypertension Brother   . Vision loss Brother   . Cancer Sister        BILE DUCT  . Arthritis Brother   . Hypertension Brother   . Lung cancer Cousin        Stage IV, never smoker, think secondary to Radon    Past medical history, surgical history, medications, allergies, family history and social history reviewed with patient today and changes made to appropriate areas of the chart.   Review of Systems  Constitutional: Negative.   HENT: Negative.   Eyes: Negative.   Respiratory: Negative.   Cardiovascular: Negative.    Gastrointestinal: Negative.   Genitourinary: Negative.   Musculoskeletal: Negative.   Skin: Negative.   Neurological: Negative.   Psychiatric/Behavioral: Negative.        Objective:    BP 102/62   Pulse 97  Temp 97.7 F (36.5 C)   Ht 5' (1.524 m)   Wt 138 lb 12.8 oz (63 kg)   SpO2 97%   BMI 27.11 kg/m   Wt Readings from Last 3 Encounters:  07/13/20 138 lb 12.8 oz (63 kg)  11/03/19 151 lb (68.5 kg)  07/20/19 149 lb (67.6 kg)    Physical Exam Vitals and nursing note reviewed.  Constitutional:      General: She is not in acute distress.    Appearance: Normal appearance. She is not toxic-appearing.  HENT:     Head: Normocephalic and atraumatic.     Right Ear: Tympanic membrane, ear canal and external ear normal.     Left Ear: Tympanic membrane, ear canal and external ear normal.     Nose: Nose normal. No congestion.     Mouth/Throat:     Mouth: Mucous membranes are moist.     Pharynx: Oropharynx is clear. No oropharyngeal exudate or posterior oropharyngeal erythema.  Eyes:     General: No scleral icterus.    Extraocular Movements: Extraocular movements intact.     Conjunctiva/sclera:     Left eye: Left conjunctiva is injected.     Pupils: Pupils are equal, round, and reactive to light.  Neck:     Vascular: No carotid bruit.  Cardiovascular:     Rate and Rhythm: Normal rate and regular rhythm.     Heart sounds: Normal heart sounds. No murmur heard.   Pulmonary:     Effort: Pulmonary effort is normal. No respiratory distress.     Breath sounds: Normal breath sounds. No wheezing, rhonchi or rales.  Abdominal:     General: Abdomen is flat. Bowel sounds are normal. There is no distension.     Palpations: Abdomen is soft.     Tenderness: There is no abdominal tenderness.  Musculoskeletal:        General: Normal range of motion.     Cervical back: Normal range of motion and neck supple. No tenderness.     Right lower leg: No edema.     Left lower leg: No edema.   Lymphadenopathy:     Cervical: No cervical adenopathy.  Skin:    General: Skin is warm and dry.     Coloration: Skin is not jaundiced or pale.  Neurological:     General: No focal deficit present.     Mental Status: She is alert and oriented to person, place, and time.     Motor: No weakness.     Gait: Gait normal.  Psychiatric:        Mood and Affect: Mood normal.        Behavior: Behavior normal.        Thought Content: Thought content normal.        Judgment: Judgment normal.       Assessment & Plan:   Problem List Items Addressed This Visit      Other   Overweight (BMI 25.0-29.9)    Patient has lost 13 pounds since last visit.  Congratulated on weight loss. Encouraged continued physical activity with goal 30 minutes 5 times weekly.  Increase weight bearing activity.        Moderate episode of recurrent major depressive disorder (HCC)    Chronic.  Stable on Celexa 40 mg daily.  PHQ-9 and GAD-7 0 today.  No SI/HI today.  Continue Celexa 40 mg daily for now.  Refills given. Follow up in 6 months - 1 year.  Mixed anxiety and depressive disorder    Chronic.  Stable on Celexa 40 mg daily.  PHQ-9 and GAD-7 0 today.  No SI/HI today.  Continue Celexa 40 mg daily for now.  Refills given. Follow up in 6 months - 1 year.       ADD (attention deficit disorder)    Chronic.  Stable on Wellbutrin XL 150 mg daily.  Continue this medication for now.  Want to avoid stimulants as these have made her anxiety worse in the past.  Follow up in 6 months - 1 year.      Relevant Orders   TSH   COMPLETE METABOLIC PANEL WITH GFR   CBC with Differential/Platelet    Other Visit Diagnoses    Annual physical exam    -  Primary   Relevant Orders   Hemoglobin A1c   VITAMIN D 25 Hydroxy (Vit-D Deficiency, Fractures)   TSH   Lipid panel   COMPLETE METABOLIC PANEL WITH GFR   CBC with Differential/Platelet   Encounter for lipid screening for cardiovascular disease       Relevant Orders    Lipid panel   Vitamin D deficiency       Relevant Orders   VITAMIN D 25 Hydroxy (Vit-D Deficiency, Fractures)   Screening for diabetes mellitus       Relevant Orders   Hemoglobin A1c       Follow up plan: Return in about 1 year (around 07/13/2021) for CPE.   LABORATORY TESTING:  - Pap smear: done elsewhere  IMMUNIZATIONS:   - Tdap: Tetanus vaccination status reviewed: last tetanus booster within 10 years. - Influenza: Postponed to flu season - Pneumovax: Not applicable - Prevnar: Not applicable - HPV: Not applicable - Zostavax vaccine: Not applicable - GQQPY-19 vaccine: Fully vaccinated with 2 doses of Moderna and booster  SCREENING: -Mammogram: Up to date  - Colonoscopy: Up to date  - Bone Density: Done elsewhere  -Hearing Test: Not applicable  -Spirometry: Not applicable   PATIENT COUNSELING:   Advised to take 1 mg of folate supplement per day if capable of pregnancy.   Sexuality: Discussed sexually transmitted diseases, partner selection, use of condoms, avoidance of unintended pregnancy  and contraceptive alternatives.   Advised to avoid cigarette smoking.  I discussed with the patient that most people either abstain from alcohol or drink within safe limits (<=14/week and <=4 drinks/occasion for males, <=7/weeks and <= 3 drinks/occasion for females) and that the risk for alcohol disorders and other health effects rises proportionally with the number of drinks per week and how often a drinker exceeds daily limits.  Discussed cessation/primary prevention of drug use and availability of treatment for abuse.   Diet: Encouraged to adjust caloric intake to maintain  or achieve ideal body weight, to reduce intake of dietary saturated fat and total fat, to limit sodium intake by avoiding high sodium foods and not adding table salt, and to maintain adequate dietary potassium and calcium preferably from fresh fruits, vegetables, and low-fat dairy products.    stressed the  importance of regular exercise  Injury prevention: Discussed safety belts, safety helmets, smoke detector, smoking near bedding or upholstery.   Dental health: Discussed importance of regular tooth brushing, flossing, and dental visits.    NEXT PREVENTATIVE PHYSICAL DUE IN 1 YEAR. Return in about 1 year (around 07/13/2021) for CPE.

## 2020-07-13 NOTE — Assessment & Plan Note (Signed)
Chronic.  Stable on Celexa 40 mg daily.  PHQ-9 and GAD-7 0 today.  No SI/HI today.  Continue Celexa 40 mg daily for now.  Refills given. Follow up in 6 months - 1 year.  

## 2020-07-13 NOTE — Assessment & Plan Note (Signed)
Patient has lost 13 pounds since last visit.  Congratulated on weight loss. Encouraged continued physical activity with goal 30 minutes 5 times weekly.  Increase weight bearing activity.

## 2020-07-13 NOTE — Patient Instructions (Signed)
Nice meeting you today, Shelley Macias!  We will let you know about your lab results on Monday.   Please let me know if you need anything.  See you in 1 year.    Preventive Care 16-56 Years Old, Female Preventive care refers to lifestyle choices and visits with your health care provider that can promote health and wellness. This includes:  A yearly physical exam. This is also called an annual wellness visit.  Regular dental and eye exams.  Immunizations.  Screening for certain conditions.  Healthy lifestyle choices, such as: ? Eating a healthy diet. ? Getting regular exercise. ? Not using drugs or products that contain nicotine and tobacco. ? Limiting alcohol use. What can I expect for my preventive care visit? Physical exam Your health care provider will check your:  Height and weight. These may be used to calculate your BMI (body mass index). BMI is a measurement that tells if you are at a healthy weight.  Heart rate and blood pressure.  Body temperature.  Skin for abnormal spots. Counseling Your health care provider may ask you questions about your:  Past medical problems.  Family's medical history.  Alcohol, tobacco, and drug use.  Emotional well-being.  Home life and relationship well-being.  Sexual activity.  Diet, exercise, and sleep habits.  Work and work Statistician.  Access to firearms.  Method of birth control.  Menstrual cycle.  Pregnancy history. What immunizations do I need? Vaccines are usually given at various ages, according to a schedule. Your health care provider will recommend vaccines for you based on your age, medical history, and lifestyle or other factors, such as travel or where you work.   What tests do I need? Blood tests  Lipid and cholesterol levels. These may be checked every 5 years, or more often if you are over 62 years old.  Hepatitis C test.  Hepatitis B test. Screening  Lung cancer screening. You may have this  screening every year starting at age 66 if you have a 30-pack-year history of smoking and currently smoke or have quit within the past 15 years.  Colorectal cancer screening. ? All adults should have this screening starting at age 19 and continuing until age 63. ? Your health care provider may recommend screening at age 82 if you are at increased risk. ? You will have tests every 1-10 years, depending on your results and the type of screening test.  Diabetes screening. ? This is done by checking your blood sugar (glucose) after you have not eaten for a while (fasting). ? You may have this done every 1-3 years.  Mammogram. ? This may be done every 1-2 years. ? Talk with your health care provider about when you should start having regular mammograms. This may depend on whether you have a family history of breast cancer.  BRCA-related cancer screening. This may be done if you have a family history of breast, ovarian, tubal, or peritoneal cancers.  Pelvic exam and Pap test. ? This may be done every 3 years starting at age 30. ? Starting at age 56, this may be done every 5 years if you have a Pap test in combination with an HPV test. Other tests  STD (sexually transmitted disease) testing, if you are at risk.  Bone density scan. This is done to screen for osteoporosis. You may have this scan if you are at high risk for osteoporosis. Talk with your health care provider about your test results, treatment options, and if necessary, the  need for more tests. Follow these instructions at home: Eating and drinking  Eat a diet that includes fresh fruits and vegetables, whole grains, lean protein, and low-fat dairy products.  Take vitamin and mineral supplements as recommended by your health care provider.  Do not drink alcohol if: ? Your health care provider tells you not to drink. ? You are pregnant, may be pregnant, or are planning to become pregnant.  If you drink alcohol: ? Limit how  much you have to 0-1 drink a day. ? Be aware of how much alcohol is in your drink. In the [place] one drink equals one 12 oz bottle of beer (355 mL), one 5 oz glass of wine (148 mL), or one 1 oz glass of hard liquor (44 mL).   Lifestyle  Take daily care of your teeth and gums. Brush your teeth every morning and night with fluoride toothpaste. Floss one time each day.  Stay active. Exercise for at least 30 minutes 5 or more days each week.  Do not use any products that contain nicotine or tobacco, such as cigarettes, e-cigarettes, and chewing tobacco. If you need help quitting, ask your health care provider.  Do not use drugs.  If you are sexually active, practice safe sex. Use a condom or other form of protection to prevent STIs (sexually transmitted infections).  If you do not wish to become pregnant, use a form of birth control. If you plan to become pregnant, see your health care provider for a prepregnancy visit.  If told by your health care provider, take low-dose aspirin daily starting at age 70.  Find healthy ways to cope with stress, such as: ? Meditation, yoga, or listening to music. ? Journaling. ? Talking to a trusted person. ? Spending time with friends and family. Safety  Always wear your seat belt while driving or riding in a vehicle.  Do not drive: ? If you have been drinking alcohol. Do not ride with someone who has been drinking. ? When you are tired or distracted. ? While texting.  Wear a helmet and other protective equipment during sports activities.  If you have firearms in your house, make sure you follow all gun safety procedures. What's next?  Visit your health care provider once a year for an annual wellness visit.  Ask your health care provider how often you should have your eyes and teeth checked.  Stay up to date on all vaccines. This information is not intended to replace advice given to you by your health care provider. Make sure you discuss any  questions you have with your health care provider. Document Revised: 11/15/2019 Document Reviewed: 10/22/2017 Elsevier Patient Education  2021 [employer].

## 2020-07-13 NOTE — Assessment & Plan Note (Signed)
Chronic.  Stable on Wellbutrin XL 150 mg daily.  Continue this medication for now.  Want to avoid stimulants as these have made her anxiety worse in the past.  Follow up in 6 months - 1 year.

## 2020-07-14 LAB — HEMOGLOBIN A1C
Hgb A1c MFr Bld: 5.3 % of total Hgb (ref <5.7)
Mean Plasma Glucose: 105 mg/dL
eAG (mmol/L): 5.8 mmol/L

## 2020-07-14 LAB — COMPLETE METABOLIC PANEL WITH GFR
AG Ratio: 2 (calc) (ref 1.0–2.5)
ALT: 14 U/L (ref 6–29)
AST: 16 U/L (ref 10–35)
Albumin: 4.3 g/dL (ref 3.6–5.1)
Alkaline phosphatase (APISO): 63 U/L (ref 37–153)
BUN: 17 mg/dL (ref 7–25)
CO2: 27 mmol/L (ref 20–32)
Calcium: 9.5 mg/dL (ref 8.6–10.4)
Chloride: 105 mmol/L (ref 98–110)
Creat: 1.02 mg/dL (ref 0.50–1.05)
GFR, Est African American: 71 mL/min/{1.73_m2} (ref >=60)
GFR, Est Non African American: 61 mL/min/{1.73_m2} (ref >=60)
Globulin: 2.2 g/dL (calc) (ref 1.9–3.7)
Glucose, Bld: 80 mg/dL (ref 65–99)
Potassium: 4.6 mmol/L (ref 3.5–5.3)
Sodium: 139 mmol/L (ref 135–146)
Total Bilirubin: 0.4 mg/dL (ref 0.2–1.2)
Total Protein: 6.5 g/dL (ref 6.1–8.1)

## 2020-07-14 LAB — LIPID PANEL
Cholesterol: 183 mg/dL (ref <200)
HDL: 54 mg/dL (ref >=50)
LDL Cholesterol (Calc): 106 mg/dL (calc) — ABNORMAL HIGH
Non-HDL Cholesterol (Calc): 129 mg/dL (calc) (ref <130)
Total CHOL/HDL Ratio: 3.4 (calc) (ref <5.0)
Triglycerides: 132 mg/dL (ref <150)

## 2020-07-14 LAB — CBC WITH DIFFERENTIAL/PLATELET
Absolute Monocytes: 549 cells/uL (ref 200–950)
Basophils Absolute: 18 cells/uL (ref 0–200)
Basophils Relative: 0.2 %
Eosinophils Absolute: 387 cells/uL (ref 15–500)
Eosinophils Relative: 4.3 %
HCT: 41.8 % (ref 35.0–45.0)
Hemoglobin: 13.9 g/dL (ref 11.7–15.5)
Lymphs Abs: 2601 cells/uL (ref 850–3900)
MCH: 32.3 pg (ref 27.0–33.0)
MCHC: 33.3 g/dL (ref 32.0–36.0)
MCV: 97 fL (ref 80.0–100.0)
MPV: 10.3 fL (ref 7.5–12.5)
Monocytes Relative: 6.1 %
Neutro Abs: 5445 cells/uL (ref 1500–7800)
Neutrophils Relative %: 60.5 %
Platelets: 244 10*3/uL (ref 140–400)
RBC: 4.31 10*6/uL (ref 3.80–5.10)
RDW: 11.8 % (ref 11.0–15.0)
Total Lymphocyte: 28.9 %
WBC: 9 10*3/uL (ref 3.8–10.8)

## 2020-07-14 LAB — VITAMIN D 25 HYDROXY (VIT D DEFICIENCY, FRACTURES): Vit D, 25-Hydroxy: 50 ng/mL (ref 30–100)

## 2020-07-14 LAB — TSH: TSH: 1.58 mIU/L (ref 0.40–4.50)

## 2020-07-31 ENCOUNTER — Encounter: Payer: Self-pay | Admitting: *Deleted

## 2020-08-14 ENCOUNTER — Other Ambulatory Visit (HOSPITAL_COMMUNITY): Payer: Self-pay

## 2020-08-22 ENCOUNTER — Other Ambulatory Visit (HOSPITAL_COMMUNITY): Payer: Self-pay

## 2020-12-12 ENCOUNTER — Other Ambulatory Visit (HOSPITAL_COMMUNITY): Payer: Self-pay

## 2021-02-01 DIAGNOSIS — M8580 Other specified disorders of bone density and structure, unspecified site: Secondary | ICD-10-CM | POA: Diagnosis not present

## 2021-02-01 DIAGNOSIS — Z6829 Body mass index (BMI) 29.0-29.9, adult: Secondary | ICD-10-CM | POA: Diagnosis not present

## 2021-02-01 DIAGNOSIS — Z01419 Encounter for gynecological examination (general) (routine) without abnormal findings: Secondary | ICD-10-CM | POA: Diagnosis not present

## 2021-02-24 HISTORY — PX: BREAST BIOPSY: SHX20

## 2021-03-26 ENCOUNTER — Other Ambulatory Visit: Payer: Self-pay | Admitting: Nurse Practitioner

## 2021-03-26 ENCOUNTER — Other Ambulatory Visit: Payer: Self-pay

## 2021-03-26 ENCOUNTER — Other Ambulatory Visit (HOSPITAL_COMMUNITY): Payer: Self-pay

## 2021-03-26 ENCOUNTER — Telehealth: Payer: Self-pay

## 2021-03-26 ENCOUNTER — Telehealth: Payer: Self-pay | Admitting: Family Medicine

## 2021-03-26 DIAGNOSIS — Z130 Encounter for screening for diseases of the blood and blood-forming organs and certain disorders involving the immune mechanism: Secondary | ICD-10-CM

## 2021-03-26 DIAGNOSIS — Z136 Encounter for screening for cardiovascular disorders: Secondary | ICD-10-CM

## 2021-03-26 DIAGNOSIS — Z13220 Encounter for screening for lipoid disorders: Secondary | ICD-10-CM

## 2021-03-26 DIAGNOSIS — E559 Vitamin D deficiency, unspecified: Secondary | ICD-10-CM

## 2021-03-26 DIAGNOSIS — Z1329 Encounter for screening for other suspected endocrine disorder: Secondary | ICD-10-CM

## 2021-03-26 DIAGNOSIS — Z131 Encounter for screening for diabetes mellitus: Secondary | ICD-10-CM

## 2021-03-26 MED ORDER — CITALOPRAM HYDROBROMIDE 40 MG PO TABS
40.0000 mg | ORAL_TABLET | Freq: Every day | ORAL | 1 refills | Status: DC
  Filled 2021-03-26: qty 90, 90d supply, fill #0

## 2021-03-26 NOTE — Telephone Encounter (Signed)
Per chart PCP had already been changed, even though pt has not established care with new provider. This prompted a refill refusal as pt did not have our provider listed as PCP. This has been changed back and refill has been sent to pharmacy as requested.   Spoke with pt and advised of change and refill. Nothing further needed at this time.

## 2021-03-26 NOTE — Telephone Encounter (Signed)
Needing labs for physical in May

## 2021-03-26 NOTE — Telephone Encounter (Signed)
Pt called in requesting a refill of citalopram (CELEXA) 40 MG tablet. Pt states that she is out of this med. Please advise.  Cb#: [phone]

## 2021-03-26 NOTE — Telephone Encounter (Signed)
Patient last labs via previous PCP 06/2020:  CBC, CMP, Lipid, TSH, Vit D, HgbA1c

## 2021-03-27 ENCOUNTER — Other Ambulatory Visit (HOSPITAL_COMMUNITY): Payer: Self-pay

## 2021-03-27 ENCOUNTER — Other Ambulatory Visit: Payer: Self-pay | Admitting: Nurse Practitioner

## 2021-03-27 MED ORDER — CITALOPRAM HYDROBROMIDE 40 MG PO TABS
40.0000 mg | ORAL_TABLET | Freq: Every day | ORAL | 1 refills | Status: DC
  Filled 2021-03-27 – 2021-06-17 (×3): qty 90, 90d supply, fill #0
  Filled 2021-09-17: qty 90, 90d supply, fill #1

## 2021-03-27 MED ORDER — BUPROPION HCL ER (XL) 150 MG PO TB24
150.0000 mg | ORAL_TABLET | Freq: Every day | ORAL | 1 refills | Status: DC
  Filled 2021-03-27 – 2021-06-17 (×3): qty 90, 90d supply, fill #0
  Filled 2021-09-17: qty 90, 90d supply, fill #1

## 2021-03-27 NOTE — Telephone Encounter (Signed)
Blood work ordered in Epic. Patient notified. 

## 2021-03-27 NOTE — Progress Notes (Signed)
Received message from patient requesting refills of Celexa and Wellbutrin as they had been denied by the pharmacy.  Refills sent in.

## 2021-03-27 NOTE — Telephone Encounter (Signed)
Coral Spikes, DO    Please order same labs.   Thank you   Dr. Lacinda Axon

## 2021-04-09 ENCOUNTER — Other Ambulatory Visit (HOSPITAL_COMMUNITY): Payer: Self-pay

## 2021-04-11 ENCOUNTER — Other Ambulatory Visit: Payer: Self-pay

## 2021-04-11 ENCOUNTER — Inpatient Hospital Stay: Payer: Self-pay | Attending: Oncology | Admitting: Licensed Clinical Social Worker

## 2021-04-11 ENCOUNTER — Encounter: Payer: Self-pay | Admitting: Licensed Clinical Social Worker

## 2021-04-11 DIAGNOSIS — Z803 Family history of malignant neoplasm of breast: Secondary | ICD-10-CM

## 2021-04-11 DIAGNOSIS — Z8481 Family history of carrier of genetic disease: Secondary | ICD-10-CM

## 2021-04-11 NOTE — Progress Notes (Signed)
REFERRING PROVIDER: Self-referred  PRIMARY PROVIDER:  Coral Spikes, DO  PRIMARY REASON FOR VISIT:  1. Family history of breast cancer gene mutation in first degree relative   2. Family history of breast cancer    I connected with Shelley Macias on 04/11/2021 at 9:00 AM EDT by MyChart video conference and verified that I am speaking with the correct person using two identifiers.    Patient location: home Provider location: [place]:   Shelley Macias, a 56 y.o. female, was seen for a [facility] cancer genetics consultation due to a family history of cancer, and her mother's recent genetic testing that showed a BARD1 mutation.  Shelley Macias presents to clinic today to discuss the possibility of a hereditary predisposition to cancer, genetic testing, and to further clarify her future cancer risks, as well as potential cancer risks for family members.   Shelley Macias is a 56 y.o. female with no personal history of cancer.    CANCER HISTORY:  Oncology History   No history exists.     RISK FACTORS:  Menarche was at age 60.  First live birth at age 50.  OCP use for approximately less than a year. Ovaries intact: yes.  Hysterectomy: no.  Menopausal status: postmenopausal.  HRT use: 0 years. Colonoscopy: yes; normal. Mammogram within the last year: yes. Number of breast biopsies: 0.  Past Medical History:  Diagnosis Date   Allergy    Anxiety    Depression    Phreesia 07/18/2019   Esophageal ulcer    Family history of breast cancer    Family history of breast cancer gene mutation in first degree relative    GERD (gastroesophageal reflux disease)    Seasonal allergies    Urinary tract infection     Past Surgical History:  Procedure Laterality Date   [place] N/A    Phreesia 07/18/2019   COLONOSCOPY N/A 10/25/2014   Procedure: COLONOSCOPY;  Surgeon: Rogene Houston, MD;  [facility]  [facility];  Service: Endoscopy;  Laterality: N/A;  1030 -- cancel order for TCS   ESOPHAGOGASTRODUODENOSCOPY     ESOPHAGOGASTRODUODENOSCOPY N/A 10/25/2014   Procedure: ESOPHAGOGASTRODUODENOSCOPY (EGD);  Surgeon: Rogene Houston, MD;  Location: [facility];  Service: Endoscopy;  Laterality: N/A;    Social History   Socioeconomic History   Marital status: Married    Spouse name: Not on file   Number of children: Not on file   Years of education: Not on file   Highest education level: Not on file  Occupational History   Not on file  Tobacco Use   Smoking status: Never   Smokeless tobacco: Never  Substance and Sexual Activity   Alcohol use: No   Drug use: No   Sexual activity: Yes    Birth control/protection: Surgical  Other Topics Concern   Not on file  Social History Narrative   Not on file   Social Determinants of Health   Financial Resource Strain: Not on file  Food Insecurity: Not on file  Transportation Needs: Not on file  Physical Activity: Not on file  Stress: Not on file  Social Connections: Not on file     FAMILY HISTORY:  We obtained a detailed, 4-generation family history.  Significant diagnoses are listed below: Family History  Problem Relation Age of Onset   Hearing loss Mother    Dementia Mother  Stroke Mother    Hypertension Mother    Breast cancer Mother 75       BARD1+   Heart disease Father        A- fib   Hyperlipidemia Father    Hypertension Father    Diabetes Father    Cancer Father        Basal cell   Mental illness Sister    Learning disabilities Sister    Cancer Sister        BILE DUCT d, 52   Diabetes Brother    Hypertension Brother    Vision loss Brother    Arthritis Brother    Hypertension Brother    Breast cancer Maternal Aunt        d. 58s   Lung cancer Cousin        Stage IV, never smoker, think secondary to Radon   Shelley Macias has 2 sons, 43 and 82. She had 1 sister who passed at 70 due to bile duct cancer. She  also has 3 brothers and 1 adopted sister, no cancers.  Shelley Macias mother was recently diagnosed with breast cancer at 93 and underwent genetic testing that revealed a BARD1 pathogenic variant. Patient had 2 maternal aunts and 1 uncle. One aunt had breast cancer in her 64s. Maternal grandmother had multiple myeloma and died in her 42s, and her mother (patient's great grandmother) had breast cancer in her 59s.   Shelley Macias father had prostate cancer in his 58s and is living at 35. Patient had 2 paternal uncles, no cancer aside from skin cancer. Paternal cousin has lung cancer. Paternal grandparents both passed over 71.   Shelley Macias is aware of previous family history of genetic testing for hereditary cancer risks. Patient's maternal ancestors are of unknown descent, and paternal ancestors are of unknown descent. There is no reported Ashkenazi Jewish ancestry. There is no known consanguinity.    GENETIC COUNSELING ASSESSMENT: Shelley Macias is a 56 y.o. female with a family history of a BARD1 mutation. We, therefore, discussed and recommended the following at today's visit.   DISCUSSION: We discussed that approximately 10% of breast cancer is hereditary. We discussed the BARD1 gene in detail, noting cancer risks and potential management changes. We discussed that testing is beneficial for several reasons including knowing about cancer risks, identifying potential screening and risk-reduction options that may be appropriate, and to understand if other family members could be at risk for cancer and allow them to undergo genetic testing.   We reviewed the characteristics, features and inheritance patterns of hereditary cancer syndromes. We also discussed genetic testing, including the appropriate family members to test, the process of testing, insurance coverage and turn-around-time for results. We discussed the implications of a negative, positive and/or variant of uncertain significant result. We  recommended Shelley Macias pursue genetic testing for the known familial mutation in [place].   Based on Ms. Nickell's family history of cancer, she meets medical criteria for genetic testing.This testing will be at no cost through [facility]'s family variant testing program.   PLAN: After considering the risks, benefits, and limitations, Shelley Macias provided informed consent to pursue genetic testing. A saliva kit was mailed to her and she will send the sample to [facility] for analysis of the BARD1 gene. Results should be available within approximately 2-3 weeks' time, at which point they will be disclosed by telephone to Shelley Macias, as will any additional recommendations warranted by these results. Shelley Macias will receive a summary  of her genetic counseling visit and a copy of her results once available. This information will also be available in Epic.   Shelley Macias questions were answered to her satisfaction today. Our contact information was provided should additional questions or concerns arise. Thank you for the referral and allowing [place] to share in the care of your patient.   Faith Rogue, MS, [facility] Genetic Counselor [email or web address] Phone: [phone]  The patient was seen for a total of 20 minutes in virtual genetic counseling.  Patient was seen alone.  Dr. Grayland Ormond was available for discussion regarding this case.   _______________________________________________________________________ For Office Staff:  Number of people involved in session: 1 Was an Intern/ student involved with case: no

## 2021-05-23 ENCOUNTER — Telehealth: Payer: Self-pay | Admitting: Hematology and Oncology

## 2021-05-23 ENCOUNTER — Telehealth: Payer: Self-pay | Admitting: Licensed Clinical Social Worker

## 2021-05-23 ENCOUNTER — Encounter: Payer: Self-pay | Admitting: Licensed Clinical Social Worker

## 2021-05-23 ENCOUNTER — Ambulatory Visit: Payer: Self-pay | Admitting: Licensed Clinical Social Worker

## 2021-05-23 DIAGNOSIS — Z1501 Genetic susceptibility to malignant neoplasm of breast: Secondary | ICD-10-CM

## 2021-05-23 DIAGNOSIS — Z1379 Encounter for other screening for genetic and chromosomal anomalies: Secondary | ICD-10-CM | POA: Insufficient documentation

## 2021-05-23 NOTE — Telephone Encounter (Signed)
Scheduled appt per 3/30 referral. Pt is aware of appt date and time. Pt is aware to arrive 15 mins prior to appt time and to bring and updated insurance card. Pt is aware of appt location.   ?

## 2021-05-23 NOTE — Progress Notes (Signed)
HPI:  Ms. Anastos was previously seen in the [facility] clinic due to a family history of cancer, her mother's recent genetic testing that revealed a BARD1 mutation,  and concerns regarding a hereditary predisposition to cancer. Please refer to our prior cancer genetics clinic note for more information regarding our discussion, assessment and recommendations, at the time. Ms. Poehler's recent genetic test results were disclosed to her, as were recommendations warranted by these results. These results and recommendations are discussed in more detail below. ? ?CANCER HISTORY:  ?Oncology History  ? No history exists.  ? ? ?FAMILY HISTORY:  ?We obtained a detailed, 4-generation family history.  Significant diagnoses are listed below: ?Family History  ?Problem Relation Age of Onset  ? Hearing loss Mother   ? Dementia Mother   ? Stroke Mother   ? Hypertension Mother   ? Breast cancer Mother 14  ?     BARD1+  ? Heart disease Father   ?     A- fib  ? Hyperlipidemia Father   ? Hypertension Father   ? Diabetes Father   ? Cancer Father   ?     Basal cell  ? Mental illness Sister   ? Learning disabilities Sister   ? Cancer Sister   ?     BILE DUCT d, 96  ? Diabetes Brother   ? Hypertension Brother   ? Vision loss Brother   ? Arthritis Brother   ? Hypertension Brother   ? Breast cancer Maternal Aunt   ?     d. 70s  ? Lung cancer Cousin   ?     Stage IV, never smoker, think secondary to Radon  ? ?Ms. Mcfaul has 2 sons, 65 and 72. She had 1 sister who passed at 38 due to bile duct cancer. She also has 3 brothers and 1 adopted sister, no cancers. ?  ?Ms. Stumpe's mother was recently diagnosed with breast cancer at 73 and underwent genetic testing that revealed a BARD1 pathogenic variant. Patient had 2 maternal aunts and 1 uncle. One aunt had breast cancer in her 53s. Maternal grandmother had multiple myeloma and died in her 73s, and her mother (patient's great grandmother) had breast cancer in her 70s.  ?   ?Ms. Maertens's father had prostate cancer in his 46s and is living at 53. Patient had 2 paternal uncles, no cancer aside from skin cancer. Paternal cousin has lung cancer. Paternal grandparents both passed over 25.  ?  ?Ms. Trinka is aware of previous family history of genetic testing for hereditary cancer risks. Patient's maternal ancestors are of unknown descent, and paternal ancestors are of unknown descent. There is no reported Ashkenazi Jewish ancestry. There is no known consanguinity. ?  ?  ? ? ?GENETIC TEST RESULTS:  ?Ms. 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 Specifically, this variant is [id number]. The test report date is 05/22/2021. ? ? ?   ?  ?BARD1 Cancer Risks: ?20-40% risk for breast cancer ?  ?Research is still continuing to help learn more about the cancers associated with BARD1 and what the exact risks are to develop these cancers.  ?  ?Management Recommendations: ?  ?Breast Cancer Screening/Risk Reduction: ?  ?Women: ?Breast cancer screening includes: ?Breast awareness beginning at age 57 ?Monthly self-breast examination beginning at age 57 ?Clinical breast examination every 6-12 months beginning at age 57 or at the age of the earliest diagnosed breast cancer  in the family, if onset was before age 65 ?Annual mammogram with consideration of tomosynthesis starting at age 57 or 43 years prior to the youngest age of diagnosis, whichever comes first ?Consider breast MRI with contrast starting at age 57 ?Evidence is insufficient for a prophylactic risk-reducing mastectomy, manage based on family history  ?  ?  ?This information is based on current understanding of the gene and may change in the future. ?  ?  ?Implications for Family Members: ?Hereditary predisposition to cancer due to pathogenic variants in the BARD1 gene has autosomal dominant inheritance. This means that an individual with a pathogenic variant has  a 50% chance of passing the condition on to his/her offspring. Identification of a pathogenic variant allows for the recognition of at-risk relatives who can pursue testing for the familial variant. ?  ?Family members are encouraged to consider genetic testing for this familial pathogenic variant. As there are generally no childhood cancer risks associated with pathogenic variants in the BARD1 gene, individuals in the family are not recommended to have testing until they reach at least 56 years of age. They may contact our office at [phone] for more information or to schedule an appointment.  ?  ?Resources:  ?If Ms. Harting is interested in information and support, there are two groups, Facing Our Risk (www.facingourrisk.com) and Bright Pink (www.brightpink.org) which some people have found useful when identified with a hereditary risk for breast and ovarian cancer. They provide opportunities to speak with other individuals from high-risk families.  ?  ?Our contact number was provided. Ms. Krukowski's questions were answered to her satisfaction, and she knows she is welcome to call us at anytime with additional questions or concerns.  ? ?Faith Rogue, MS, LCGC ?Genetic Counselor ?[email or web address] ?Phone: [phone] ? ?

## 2021-05-23 NOTE — Telephone Encounter (Signed)
Disclosed positive genetic testing. Known familial pathogenic variant in BARD1 identified.  ? ? ?

## 2021-06-10 ENCOUNTER — Other Ambulatory Visit: Payer: Self-pay

## 2021-06-10 ENCOUNTER — Inpatient Hospital Stay: Payer: No Typology Code available for payment source

## 2021-06-10 ENCOUNTER — Encounter: Payer: Self-pay | Admitting: Hematology and Oncology

## 2021-06-10 ENCOUNTER — Telehealth: Payer: Self-pay | Admitting: *Deleted

## 2021-06-10 ENCOUNTER — Inpatient Hospital Stay: Payer: No Typology Code available for payment source | Attending: Oncology | Admitting: Hematology and Oncology

## 2021-06-10 VITALS — BP 123/75 | HR 63 | Temp 97.9°F | Resp 16 | Ht 60.0 in | Wt 153.9 lb

## 2021-06-10 DIAGNOSIS — Z1501 Genetic susceptibility to malignant neoplasm of breast: Secondary | ICD-10-CM | POA: Diagnosis not present

## 2021-06-10 DIAGNOSIS — Z800 Family history of malignant neoplasm of digestive organs: Secondary | ICD-10-CM | POA: Diagnosis not present

## 2021-06-10 DIAGNOSIS — Z808 Family history of malignant neoplasm of other organs or systems: Secondary | ICD-10-CM | POA: Insufficient documentation

## 2021-06-10 DIAGNOSIS — R922 Inconclusive mammogram: Secondary | ICD-10-CM | POA: Insufficient documentation

## 2021-06-10 DIAGNOSIS — Z803 Family history of malignant neoplasm of breast: Secondary | ICD-10-CM | POA: Diagnosis present

## 2021-06-10 DIAGNOSIS — Z801 Family history of malignant neoplasm of trachea, bronchus and lung: Secondary | ICD-10-CM | POA: Diagnosis not present

## 2021-06-10 DIAGNOSIS — Z1502 Genetic susceptibility to malignant neoplasm of ovary: Secondary | ICD-10-CM | POA: Insufficient documentation

## 2021-06-10 NOTE — Telephone Encounter (Signed)
Per Dr. Rob Hickman direction - faxed results of molecular pathology to Dr. Royston Sinner ?Fax confirmation received ?

## 2021-06-10 NOTE — Progress Notes (Signed)
[place] ?CONSULT NOTE ? ?Patient Care Team: ?Coral Spikes, DO as PCP - General (Family Medicine) ? ?CHIEF COMPLAINTS/PURPOSE OF CONSULTATION:  ?At high risk for breast cancer ? ?ASSESSMENT & PLAN:  ? ?This is a very pleasant 56 year old female patient with no significant past medical history referred to high risk breast clinic because of pathogenic BARD 1 mutation. ? ?BRCA1 associated RING domain 1 also called BARD1 predisposes patients to increased risk of breast cancer and possibly ovarian cancer as well.  The risk of breast cancer is gauged at approximately 20 to 40%.  It appears that it may be associated with the ER negative breast cancer than for ER positive breast cancer.  We generally recommend mammograms at age 2 along with consideration of breast MRI with contrast for patients with this mutation.  There is no recommendations to support for risk reduction mastectomy.  We have discussed unknown long-term risk of gadolinium deposition and increased sensitivity with MRIs that may lead to more number of biopsies. ? ?With regards to ovarian cancer, again there is no good cancer screening to detect ovarian cancer.  We have faxed the molecular results to Dr. Lucillie Garfinkel, her OB/GYN per her recommendation.  Dr. Alfred Levins might suggest monitoring for ovarian cancer. ? ?I have also discussed about lifestyle interventions, regular exercise, specifically 150 minutes of workout a week, plant-based diet and self breast exams.  She was recommended to return to clinic in 6 months.  MRI of the breast ordered.  She was given the phone number to call and schedule the MRI.  She was instructed to call sooner with any new questions or concerns. ? ? ?HISTORY OF PRESENTING ILLNESS:  ?Shelley Macias 56 y.o. female is here because of BARD 1 ? ?This is a very pleasant 56 year old female patient with no significant past medical history referred to high risk breast clinic because of pathogenic BARD 1 mutation.  She  arrived to the appointment today by herself.  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.  Her mom had breast cancer in her 83s and is currently living at 65.  Her maternal aunt had breast cancer in 55s and had recurrence many years later.  Besides breast cancer in mom and maternal aunt, she denies any other family history of breast cancer, ovarian cancer.  Dad had very early stage prostate cancer and had prostatectomy.  She has been doing mammograms regularly, no abnormal mammograms reported. ?She feels well, eats healthy and denies any health complaints.   ?Rest of the pertinent 10 point ROS reviewed and negative ? ?REVIEW OF SYSTEMS:   ?Constitutional: Denies fevers, chills or abnormal night sweats ?Eyes: Denies blurriness of vision, double vision or watery eyes ?Ears, nose, mouth, throat, and face: Denies mucositis or sore throat ?Respiratory: Denies cough, dyspnea or wheezes ?Cardiovascular: Denies palpitation, chest discomfort or lower extremity swelling ?Gastrointestinal:  Denies nausea, heartburn or change in bowel habits ?Skin: Denies abnormal skin rashes ?Lymphatics: Denies new lymphadenopathy or easy bruising ?Neurological:Denies numbness, tingling or new weaknesses ?Behavioral/Psych: Mood is stable, no new changes  ?All other systems were reviewed with the patient and are negative. ? ?MEDICAL HISTORY:  ?Past Medical History:  ?Diagnosis Date  ? Allergy   ? Anxiety   ? Depression   ? [employer] 07/18/2019  ? Esophageal ulcer   ? Family history of breast cancer   ? Family history of breast cancer gene mutation in first degree relative   ? GERD (gastroesophageal reflux disease)   ?  Seasonal allergies   ? Urinary tract infection   ? ? ?SURGICAL HISTORY: ?Past Surgical History:  ?Procedure Laterality Date  ? APPENDECTOMY    ? [place]  ? CESAREAN SECTION N/A   ? [employer]rees[employer] 07/18/2019  ? COLONOSCOPY N/A 10/25/2014  ? Procedure: COLONOSCOPY;  Surgeon: Rogene Houston, MD;  Location: [facility]  END[facility];  Service: Endoscopy;  Laterality: N/A;  1030 -- cancel order for TCS  ? ESOPHAGOGASTRODUODENOSCOPY    ? ESOPHAGOGASTRODUODENOSCOPY N/A 10/25/2014  ? Procedure: ESOPHAGOGASTRODUODENOSCOPY (EGD);  Surgeon: Rogene Houston, MD;  Location: [facility];  Service: Endoscopy;  Laterality: N/A;  ? ? ?SOCIAL HISTORY: ?Social History  ? ?Socioeconomic History  ? Marital status: Married  ?  Spouse name: Not on file  ? Number of children: Not on file  ? Years of education: Not on file  ? Highest education level: Not on file  ?Occupational History  ? Not on file  ?Tobacco Use  ? Smoking status: Never  ? Smokeless tobacco: Never  ?Substance and Sexual Activity  ? Alcohol use: No  ? Drug use: No  ? Sexual activity: Yes  ?  Birth control/protection: Surgical  ?Other Topics Concern  ? Not on file  ?Social History Narrative  ? Not on file  ? ?Social Determinants of Health  ? ?Financial Resource Strain: Not on file  ?Food Insecurity: Not on file  ?Transportation Needs: Not on file  ?Physical Activity: Not on file  ?Stress: Not on file  ?Social Connections: Not on file  ?Intimate Partner Violence: Not on file  ? ? ?FAMILY HISTORY: ?Family History  ?Problem Relation Age of Onset  ? Hearing loss Mother   ? Dementia Mother   ? Stroke Mother   ? Hypertension Mother   ? Breast cancer Mother 105  ?     BARD1+  ? Heart disease Father   ?     A- fib  ? Hyperlipidemia Father   ? Hypertension Father   ? Diabetes Father   ? Cancer Father   ?     Basal cell  ? Mental illness Sister   ? Learning disabilities Sister   ? Cancer Sister   ?     BILE DUCT d, 54  ? Diabetes Brother   ? Hypertension Brother   ? Vision loss Brother   ? Arthritis Brother   ? Hypertension Brother   ? Breast cancer Maternal Aunt   ?     d. 64s  ? Lung cancer Cousin   ?     Stage IV, never smoker, think secondary to [place]  ? ? ?ALLERGIES:  is allergic to demerol [meperidine], other, and penicillins. ? ?MEDICATIONS:  ?Current Outpatient Medications  ?Medication Sig  Dispense Refill  ? b complex vitamins tablet Take 1 tablet by mouth daily.    ? buPROPion (WELLBUTRIN XL) 150 MG 24 hr tablet Take 1 tablet (150 mg total) by mouth daily. 90 tablet 1  ? calcium-vitamin D (OSCAL WITH D) 250-125 MG-UNIT tablet Take 1 tablet by mouth daily.    ? citalopram (CELEXA) 40 MG tablet Take 1 tablet (40 mg total) by mouth daily. 90 tablet 1  ? magnesium oxide (MAG-OX) 400 MG tablet Take 400 mg by mouth daily.    ? Multiple Vitamin (MULTIVITAMIN WITH MINERALS) TABS tablet Take by mouth daily.    ? vitamin B-12 (CYANOCOBALAMIN) 500 MCG tablet Take 500 mcg by mouth daily.    ? Vitamin D, Ergocalciferol, (DRISDOL)  1.25 MG (50000 UNIT) CAPS capsule Take 1 capsule (50,000 Units total) by mouth every 7 (seven) days. x12 weeks. 12 capsule 0  ? ?No current facility-administered medications for this visit.  ? ? ? ?PHYSICAL EXAMINATION: ?ECOG PERFORMANCE STATUS: 0 - Asymptomatic ? ?Vitals:  ? 06/10/21 1021  ?BP: 123/75  ?Pulse: 63  ?Resp: 16  ?Temp: 97.9 ?F (36.6 ?C)  ?SpO2: 100%  ? ?Filed Weights  ? 06/10/21 1021  ?Weight: 153 lb 14.4 oz (69.8 kg)  ? ? ?Physical Exam ?Constitutional:   ?   Appearance: Normal appearance.  ?Cardiovascular:  ?   Rate and Rhythm: Normal rate and regular rhythm.  ?   Pulses: Normal pulses.  ?   Heart sounds: Normal heart sounds.  ?Pulmonary:  ?   Effort: Pulmonary effort is normal.  ?   Breath sounds: Normal breath sounds.  ?Chest:  ?   Comments: Bilateral breasts inspected. ?No palpable masses or regional adenopathy.  Dense breast noted.45 ?Abdominal:  ?   General: Abdomen is flat. Bowel sounds are normal.  ?   Palpations: Abdomen is soft.  ?Musculoskeletal:     ?   General: No swelling or tenderness. Normal range of motion.  ?   Cervical back: Normal range of motion and neck supple. No rigidity.  ?Lymphadenopathy:  ?   Cervical: No cervical adenopathy.  ?Skin: ?   General: Skin is warm and dry.  ?Neurological:  ?   General: No focal deficit present.  ?   Mental Status: She  is alert.  ? ? ? ?LABORATORY DATA:  ?I have reviewed the data as listed ?Lab Results  ?Component Value Date  ? WBC 9.0 07/13/2020  ? HGB 13.9 07/13/2020  ? HCT 41.8 07/13/2020  ? MCV 97.0 07/13/2020  ? P

## 2021-06-17 ENCOUNTER — Other Ambulatory Visit (HOSPITAL_COMMUNITY): Payer: Self-pay

## 2021-06-19 ENCOUNTER — Ambulatory Visit (INDEPENDENT_AMBULATORY_CARE_PROVIDER_SITE_OTHER): Payer: Self-pay | Admitting: Family Medicine

## 2021-06-19 ENCOUNTER — Encounter: Payer: Self-pay | Admitting: Family Medicine

## 2021-06-19 DIAGNOSIS — Z6830 Body mass index (BMI) 30.0-30.9, adult: Secondary | ICD-10-CM

## 2021-06-19 DIAGNOSIS — E669 Obesity, unspecified: Secondary | ICD-10-CM

## 2021-06-19 DIAGNOSIS — L089 Local infection of the skin and subcutaneous tissue, unspecified: Secondary | ICD-10-CM | POA: Insufficient documentation

## 2021-06-19 MED ORDER — DOXYCYCLINE HYCLATE 100 MG PO TABS: 100.0000 mg | ORAL_TABLET | Freq: Two times a day (BID) | ORAL | 0 refills | Status: DC

## 2021-06-19 MED ORDER — SEMAGLUTIDE-WEIGHT MANAGEMENT 0.25 MG/0.5ML ~~LOC~~ SOAJ: 0.2500 mg | SUBCUTANEOUS | 0 refills | Status: DC

## 2021-06-19 MED ORDER — MUPIROCIN 2 % EX OINT: 1.0000 "application " | TOPICAL_OINTMENT | Freq: Three times a day (TID) | CUTANEOUS | 0 refills | Status: AC

## 2021-06-19 NOTE — Patient Instructions (Signed)
We will see if we can get the [facility] approved. ? ?Warm compresses to the area along with the topical and oral antibiotic. ? ?Call with concerns. ? ?Take care ? ?Dr. Lacinda Axon  ?

## 2021-06-19 NOTE — Assessment & Plan Note (Signed)
Patient interested in trying [facility].  Rx sent.  We will see if we can get this approved by her insurance. ?

## 2021-06-19 NOTE — Progress Notes (Signed)
? ?Subjective:  ?Patient ID: Shelley Macias, female    DOB: [birth year]  Age: 56 y.o. MRN: [id number] ? ?CC: ?Chief Complaint  ?Patient presents with  ? pustule on right wrist  ?  Since weekend  ? ? ?HPI: ? ?56 year old female presents for evaluation of the above. ? ?Over the weekend she noticed a raised area on the volar aspect of the wrist.  It has now turned into a pustule.  It is red and slightly tender to palpation.  No fever.  She is concerned about the area.  She believes that she may have been bitten by an ant.  No relieving factors.  No other associated symptoms. ? ?Patient also would like to discuss semaglutide for weight loss.  Patient states that she is struggling with her weight.  She would like to discuss semaglutide and the risks and benefits. ? ?Patient Active Problem List  ? Diagnosis Date Noted  ? Pustule 06/19/2021  ? Obesity 06/19/2021  ? BARD1 gene mutation positive 05/23/2021  ? Family history of breast cancer 04/11/2021  ? Osteopenia 12/08/2019  ? ADD (attention deficit disorder) 05/02/2014  ? Mixed anxiety and depressive disorder 05/02/2014  ? Seasonal allergies 05/02/2014  ? GERD (gastroesophageal reflux disease) 05/02/2014  ? ? ?Social Hx   ?Social History  ? ?Socioeconomic History  ? Marital status: Married  ?  Spouse name: Not on file  ? Number of children: Not on file  ? Years of education: Not on file  ? Highest education level: Not on file  ?Occupational History  ? Not on file  ?Tobacco Use  ? Smoking status: Never  ? Smokeless tobacco: Never  ?Substance and Sexual Activity  ? Alcohol use: No  ? Drug use: No  ? Sexual activity: Yes  ?  Birth control/protection: Surgical  ?Other Topics Concern  ? Not on file  ?Social History Narrative  ? Not on file  ? ?Social Determinants of Health  ? ?Financial Resource Strain: Not on file  ?Food Insecurity: Not on file  ?Transportation Needs: Not on file  ?Physical Activity: Not on file  ?Stress: Not on file  ?Social Connections: Not on file   ? ? ?Review of Systems ?Per HPI ? ?Objective:  ?BP 117/75   Pulse 74   Temp (!) 97.3 ?F (36.3 ?C) (Oral)   Ht 5' (1.524 m)   Wt 157 lb (71.2 kg)   BMI 30.66 kg/m?  ? ? ?  06/19/2021  ?  9:17 AM 06/10/2021  ? 10:21 AM 07/13/2020  ?  3:13 PM  ?BP/Weight  ?Systolic BP 778 242 353  ?Diastolic BP 75 75 62  ?Wt. (Lbs) 157 153.9 138.8  ?BMI 30.66 kg/m2 30.06 kg/m2 27.11 kg/m2  ? ? ?Physical Exam ?Vitals and nursing note reviewed.  ?Constitutional:   ?   General: She is not in acute distress. ?   Appearance: Normal appearance.  ?HENT:  ?   Head: Normocephalic and atraumatic.  ?Cardiovascular:  ?   Rate and Rhythm: Normal rate and regular rhythm.  ?Pulmonary:  ?   Effort: Pulmonary effort is normal. No respiratory distress.  ?Skin: ?   Comments: Volar aspect of the right wrist with a raised erythematous pustule.  ?Neurological:  ?   Mental Status: She is alert.  ?Psychiatric:     ?   Mood and Affect: Mood normal.     ?   Behavior: Behavior normal.  ? ? ?Lab Results  ?Component Value Date  ? WBC 9.0 07/13/2020  ?  HGB 13.9 07/13/2020  ? HCT 41.8 07/13/2020  ? PLT 244 07/13/2020  ? GLUCOSE 80 07/13/2020  ? CHOL 183 07/13/2020  ? TRIG 132 07/13/2020  ? HDL 54 07/13/2020  ? [place] 106 (H) 07/13/2020  ? ALT 14 07/13/2020  ? AST 16 07/13/2020  ? NA 139 07/13/2020  ? K 4.6 07/13/2020  ? CL 105 07/13/2020  ? CREATININE 1.02 07/13/2020  ? BUN 17 07/13/2020  ? CO2 27 07/13/2020  ? TSH 1.58 07/13/2020  ? HGBA1C 5.3 07/13/2020  ? ? ? ?Assessment & Plan:  ? ?Problem List Items Addressed This Visit   ? ?  ? Musculoskeletal and Integument  ? Pustule  ?  Patient has a pustule on the volar aspect of the right wrist.  Treating with warm compresses, Bactroban ointment, and doxycycline. ? ?  ?  ?  ? Other  ? Obesity  ?  Patient interested in trying [facility].  Rx sent.  We will see if we can get this approved by her insurance. ? ?  ?  ? Relevant Medications  ? Semaglutide-Weight Management 0.25 MG/0.5ML SOAJ  ? ? ?Meds ordered this encounter   ?Medications  ? Semaglutide-Weight Management 0.25 MG/0.5ML SOAJ  ?  Sig: Inject 0.25 mg into the skin once a week.  ?  Dispense:  2 mL  ?  Refill:  0  ? doxycycline (VIBRA-TABS) 100 MG tablet  ?  Sig: Take 1 tablet (100 mg total) by mouth 2 (two) times daily.  ?  Dispense:  14 tablet  ?  Refill:  0  ? mupirocin ointment (BACTROBAN) 2 %  ?  Sig: Apply 1 application. topically 3 (three) times daily for 7 days.  ?  Dispense:  30 g  ?  Refill:  0  ? ? ?Thersa Salt DO ?[place] ? ?

## 2021-06-19 NOTE — Addendum Note (Signed)
Addended by: Coral Spikes on: 06/19/2021 12:55 PM ? ? Modules accepted: Level of Service ? ?

## 2021-06-19 NOTE — Assessment & Plan Note (Signed)
Patient has a pustule on the volar aspect of the right wrist.  Treating with warm compresses, Bactroban ointment, and doxycycline. ?

## 2021-06-24 ENCOUNTER — Telehealth: Payer: Self-pay | Admitting: *Deleted

## 2021-06-24 NOTE — Telephone Encounter (Addendum)
Patient's insurance denied PA for [facility]. The medication was denied because there was no evidence the patient is actively enrolled in an exercise and caloric reduction program and/or weight loss behavioral modification plan. Patient also does not have any comobidities associated with BMI of 30. ?

## 2021-06-24 NOTE — Telephone Encounter (Addendum)
Coral Spikes, DO   ? ?Please let patient know about this.  ? ?Dr. Lenna Sciara   ? ?

## 2021-06-24 NOTE — Telephone Encounter (Signed)
Patient notified and verbalized understanding and stated she will let us know if she wants referral to [facility] [place] ?

## 2021-07-02 ENCOUNTER — Other Ambulatory Visit (HOSPITAL_COMMUNITY): Payer: Self-pay

## 2021-07-11 ENCOUNTER — Other Ambulatory Visit (HOSPITAL_COMMUNITY): Payer: Self-pay | Admitting: Hematology and Oncology

## 2021-07-11 ENCOUNTER — Ambulatory Visit (HOSPITAL_COMMUNITY)
Admission: RE | Admit: 2021-07-11 | Discharge: 2021-07-11 | Disposition: A | Discharge status: Home / Self Care | Payer: No Typology Code available for payment source | Source: Ambulatory Visit | Attending: Hematology and Oncology | Admitting: Hematology and Oncology

## 2021-07-11 DIAGNOSIS — Z1231 Encounter for screening mammogram for malignant neoplasm of breast: Secondary | ICD-10-CM

## 2021-07-11 DIAGNOSIS — Z1501 Genetic susceptibility to malignant neoplasm of breast: Secondary | ICD-10-CM | POA: Insufficient documentation

## 2021-07-11 IMAGING — MR MR BREAST BILAT WO/W CM
9 of 13 series · 30 of 48 positions shown · IV contrast (gadavist)
Comparison: Screening mammogram on [DATE]

CLINICAL DATA: Breast cancer screening. High risk. Patient is
positive for the MALDZIUS 1 gene mutation.

EXAM:
BILATERAL BREAST MRI WITH AND WITHOUT CONTRAST
TECHNIQUE: Multiplanar, multisequence MR images of both breasts were obtained
prior to and following the intravenous administration of ml of
Gadavist

[Series 2: T2 · axial · 3.0mm · 0.91mm/px · z∈[-75,+105]mm · 2 of 61 slices shown]
[im 1/61]
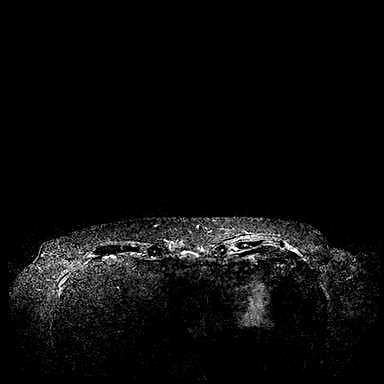
[im 61/61]
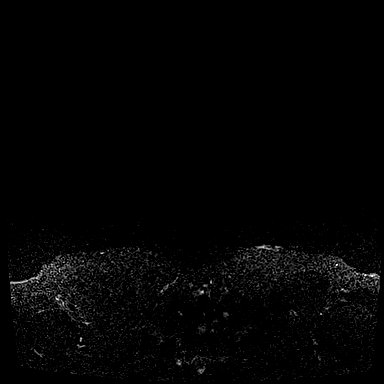

[Series 3: T1 fat-sat · axial · 1.2mm · 0.78mm/px · z∈[-71,+100]mm · 5 of 144 slices shown (1 of 4)]
[im 1/144]
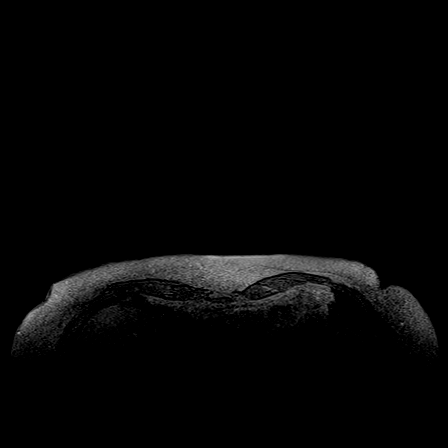
[im 36/144]
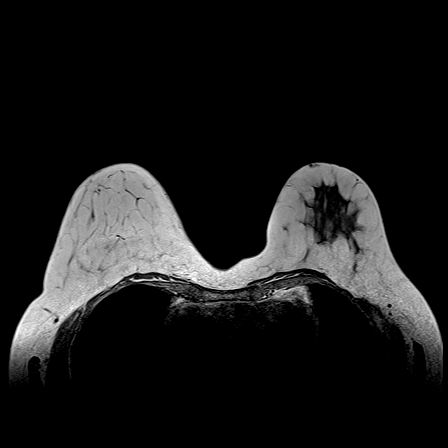
[im 72/144]
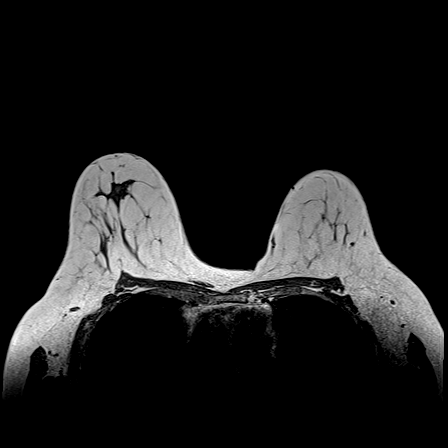
[im 108/144]
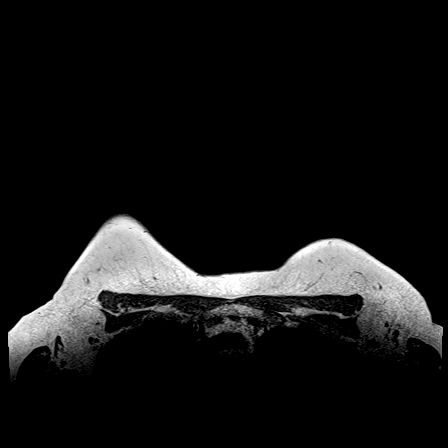
[im 144/144]
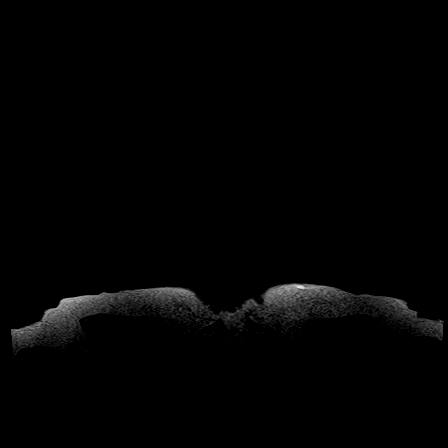

[Series 5: T1 fat-sat · axial · 1.2mm · 0.84mm/px · z∈[-62,+91]mm · 5 of 128 slices shown (2 of 4)]
[im 1/128]
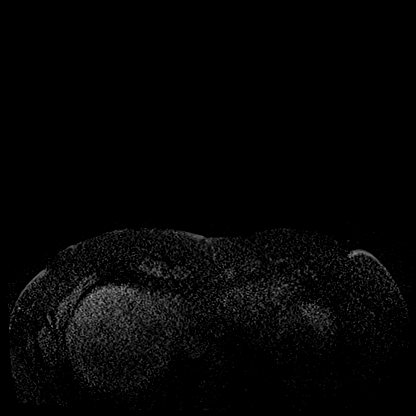
[im 32/128]
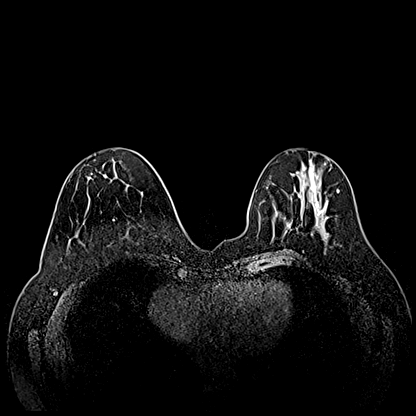
[im 64/128]
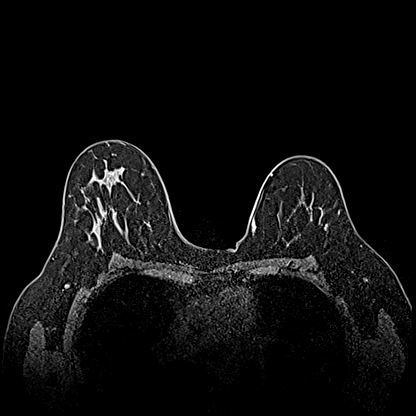
[im 96/128]
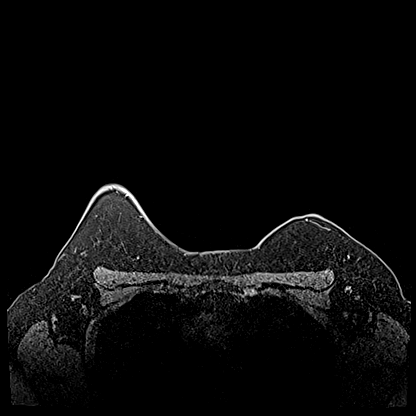
[im 128/128]
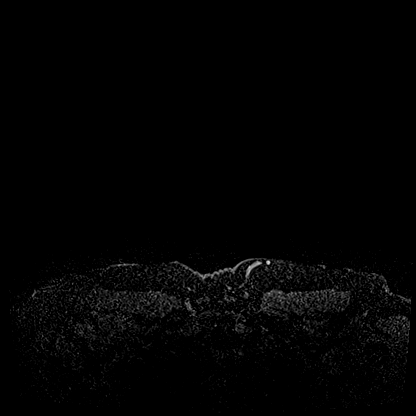

[Series 6: T1 fat-sat · axial · 1.2mm · 0.84mm/px · z∈[-62,+91]mm · 5 of 128 slices shown (3 of 4)]
[im 1/128]
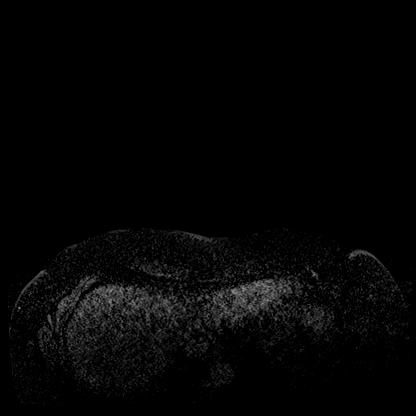
[im 32/128]
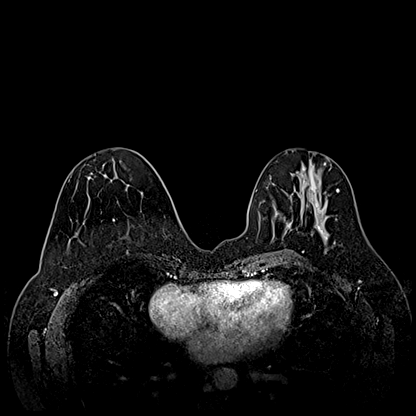
[im 64/128]
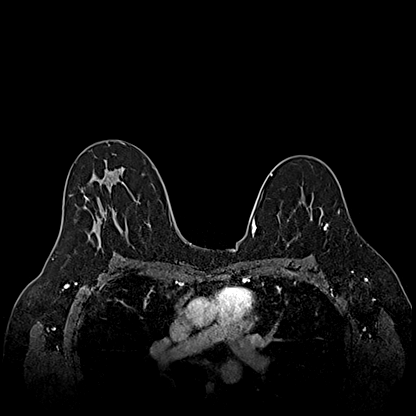
[im 96/128]
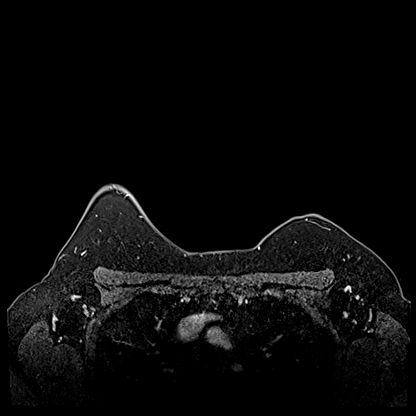
[im 128/128]
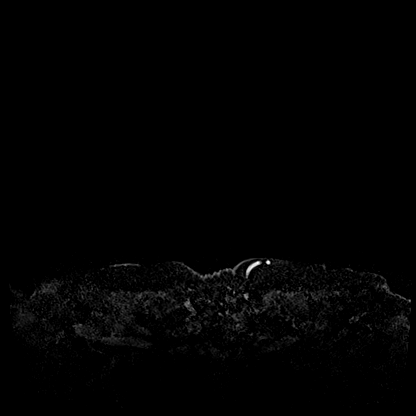

[Series 7: T1 · axial · 1.2mm · 0.84mm/px · z∈[-62,+91]mm · 5 of 128 slices shown (1 of 4)]
[im 1/128]
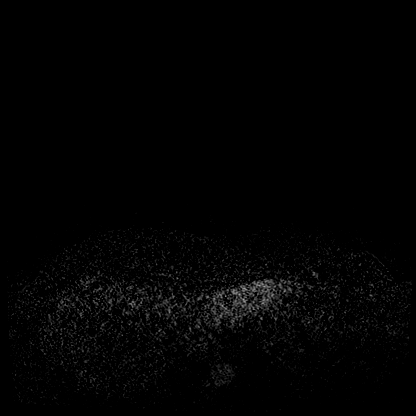
[im 32/128]
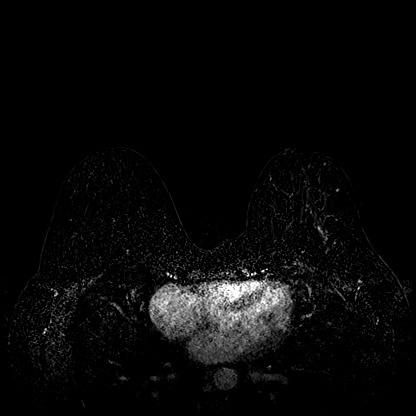
[im 64/128]
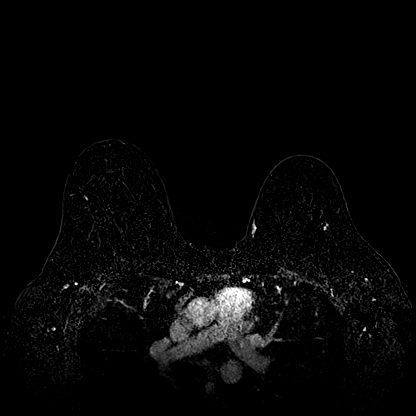
[im 96/128]
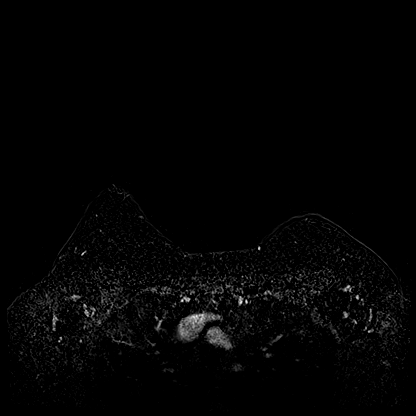
[im 128/128]
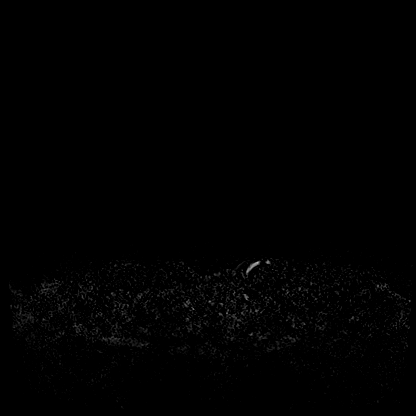

[Series 8: T1 · coronal · 350.0mm · 0.84mm/px · 1 of 3 slices shown (2 of 4)]
[im 1/3]
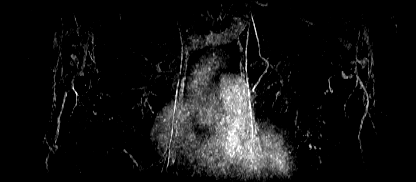

[Series 9: T1 · axial · 153.6mm · 0.84mm/px · 1 of 3 slices shown (3 of 4)]
[im 1/3]
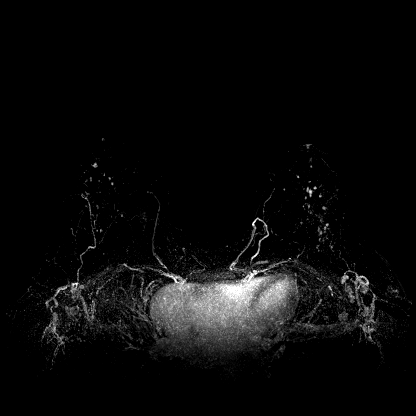

[Series 10: T1 fat-sat · axial · 1.2mm · 0.84mm/px · z∈[-62,+91]mm · 5 of 128 slices shown (4 of 4)]
[im 1/128]
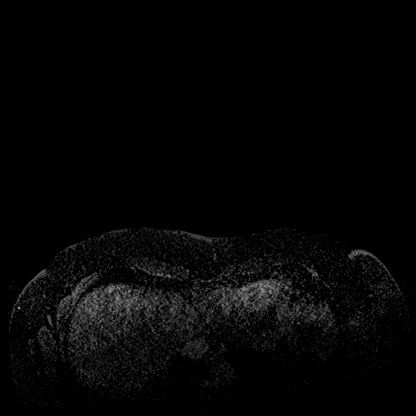
[im 32/128]
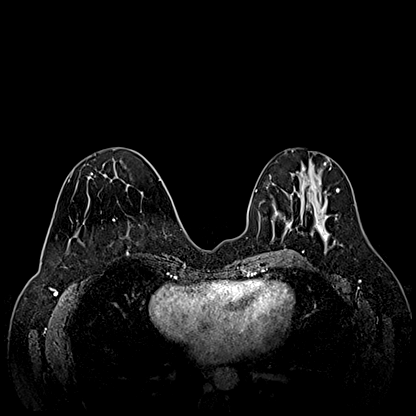
[im 64/128]
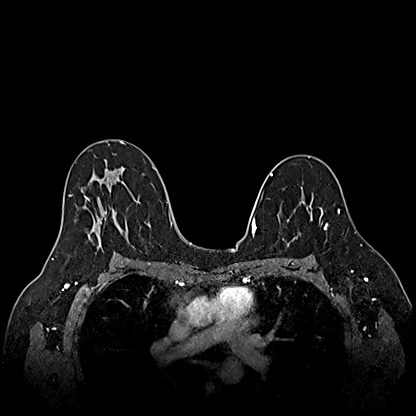
[im 96/128]
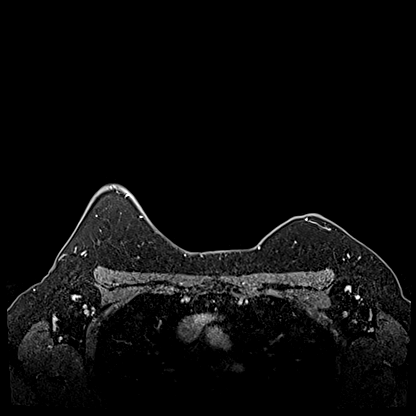
[im 128/128]
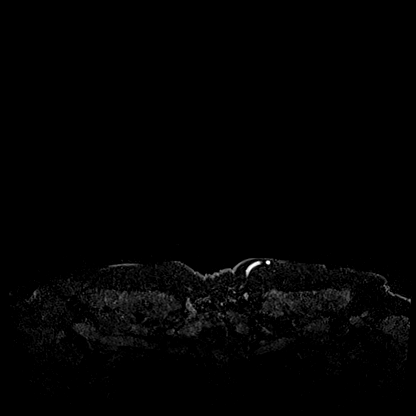

[Series 11: T1 · axial · 1.2mm · 0.84mm/px · 1 of 128 slices shown (4 of 4)]
[im 1/128]
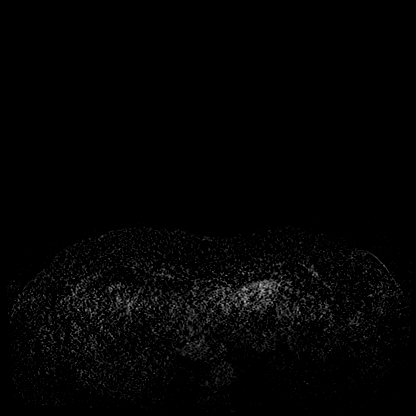

[30 of 48 positions shown; findings below may reference images not displayed]

Three-dimensional MR images were rendered by post-processing of the
original MR data on an independent workstation. The
three-dimensional MR images were interpreted, and findings are
reported in the following complete MRI report for this study. Three
dimensional images were evaluated at the independent interpreting
workstation using the DynaCAD thin client.
FINDINGS: Breast composition: b. Scattered fibroglandular tissue.

Background parenchymal enhancement: Mild

Right breast: No mass or abnormal enhancement.

Left breast: In the LOWER central LEFT breast, there is an oval
homogeneously enhancing mass with circumscribed margins, measuring
0.8 x 0.5 centimeters. Mass demonstrates persistent type enhancement
kinetics, is T2 hyperintense and T1 hypointense. (Image 103 of
series 7). Otherwise, LEFT breast is negative.

Lymph nodes: No abnormal appearing lymph nodes.

Ancillary findings:  None.
IMPRESSION: Indeterminate 0.8 centimeter mass in the LOWER central LEFT breast.

RECOMMENDATION:
Recommend MR guided core biopsy of mass in the LOWER central LEFT
breast.

BI-RADS CATEGORY  4: Suspicious.

## 2021-07-11 MED ORDER — GADOBUTROL 1 MMOL/ML IV SOLN
7.0000 mL | Freq: Once | INTRAVENOUS | Status: AC | PRN
  Administered 2021-07-11: 7 mL via INTRAVENOUS

## 2021-07-15 ENCOUNTER — Ambulatory Visit (INDEPENDENT_AMBULATORY_CARE_PROVIDER_SITE_OTHER): Payer: No Typology Code available for payment source | Admitting: Family Medicine

## 2021-07-15 ENCOUNTER — Ambulatory Visit (HOSPITAL_COMMUNITY)
Admission: RE | Admit: 2021-07-15 | Discharge: 2021-07-15 | Disposition: A | Discharge status: Home / Self Care | Payer: No Typology Code available for payment source | Source: Ambulatory Visit | Attending: Hematology and Oncology | Admitting: Hematology and Oncology

## 2021-07-15 VITALS — BP 96/54 | HR 59 | Temp 98.4°F | Ht 60.0 in | Wt 152.0 lb

## 2021-07-15 DIAGNOSIS — Z1231 Encounter for screening mammogram for malignant neoplasm of breast: Secondary | ICD-10-CM | POA: Insufficient documentation

## 2021-07-15 DIAGNOSIS — Z0000 Encounter for general adult medical examination without abnormal findings: Secondary | ICD-10-CM | POA: Insufficient documentation

## 2021-07-15 IMAGING — MG MM DIGITAL SCREENING BILAT W/ TOMO AND CAD
6 of 10 series · 6 of 30 positions shown · non-contrast
Comparison: Previous exam(s).

CLINICAL DATA: Screening.

EXAM:
DIGITAL SCREENING BILATERAL MAMMOGRAM WITH TOMOSYNTHESIS AND CAD
TECHNIQUE: Bilateral screening digital craniocaudal and mediolateral oblique
mammograms were obtained. Bilateral screening digital breast
tomosynthesis was performed. The images were evaluated with
computer-aided detection.

[R CC synth-2D]
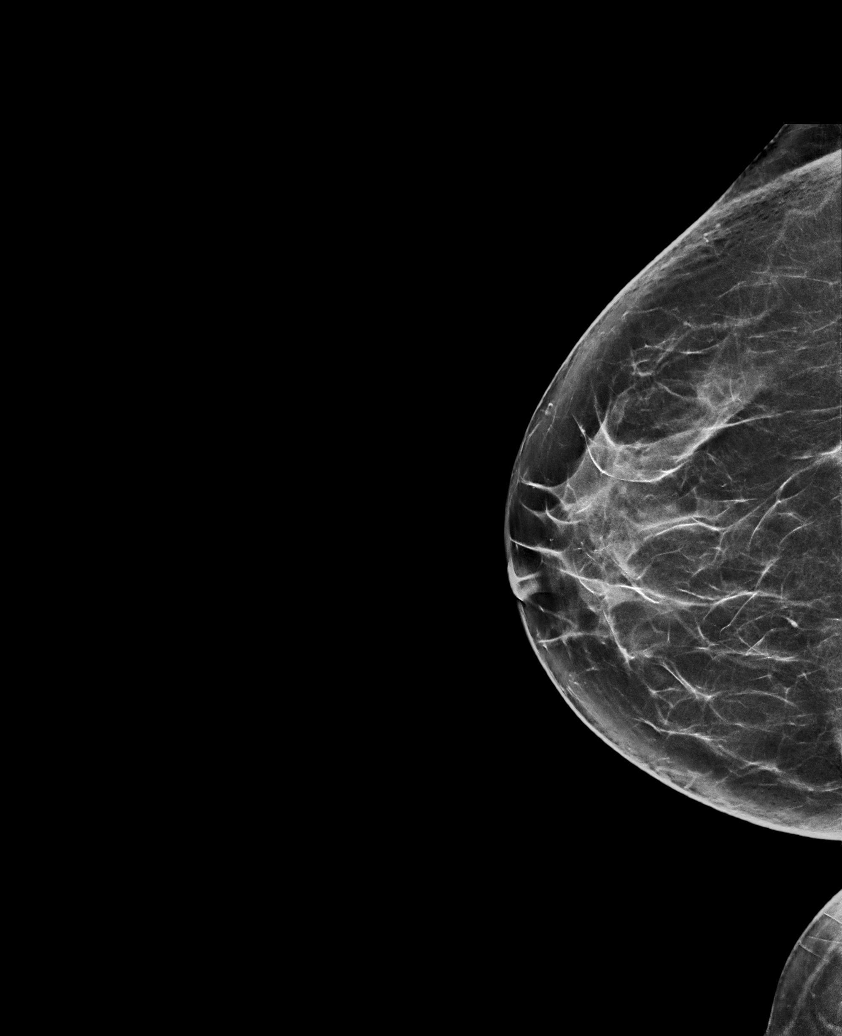

[R MLO synth-2D]
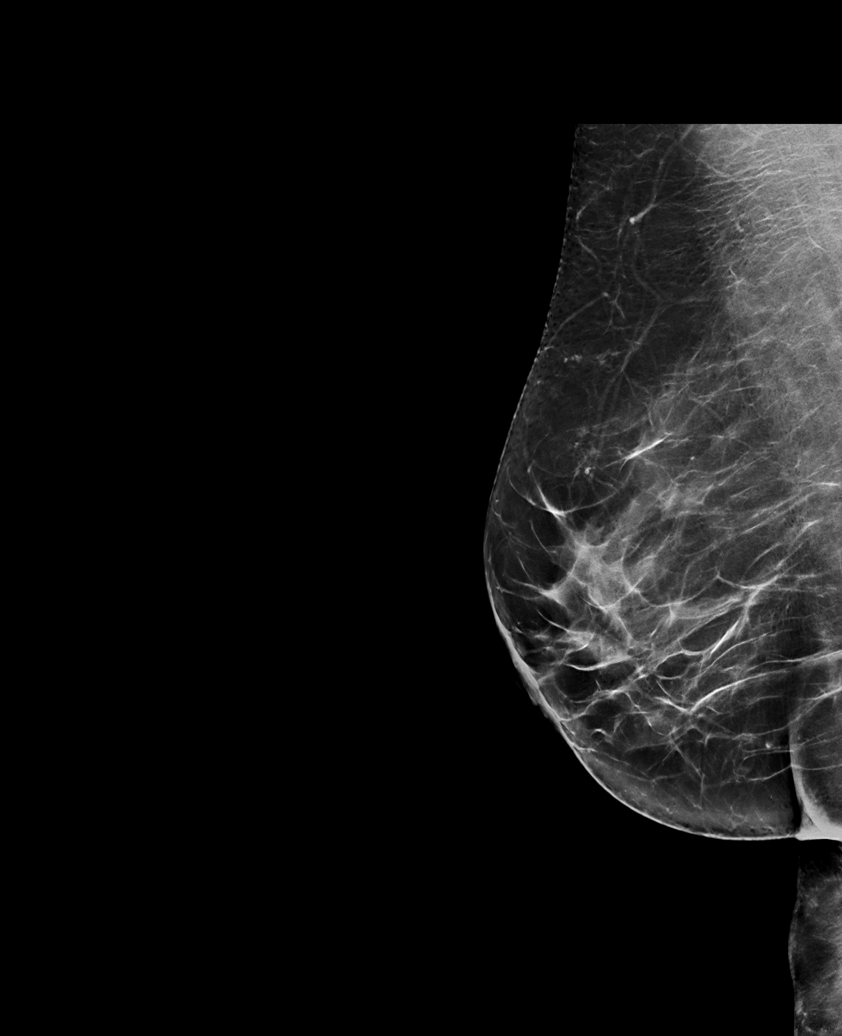

[L MLO synth-2D (1 of 2)]
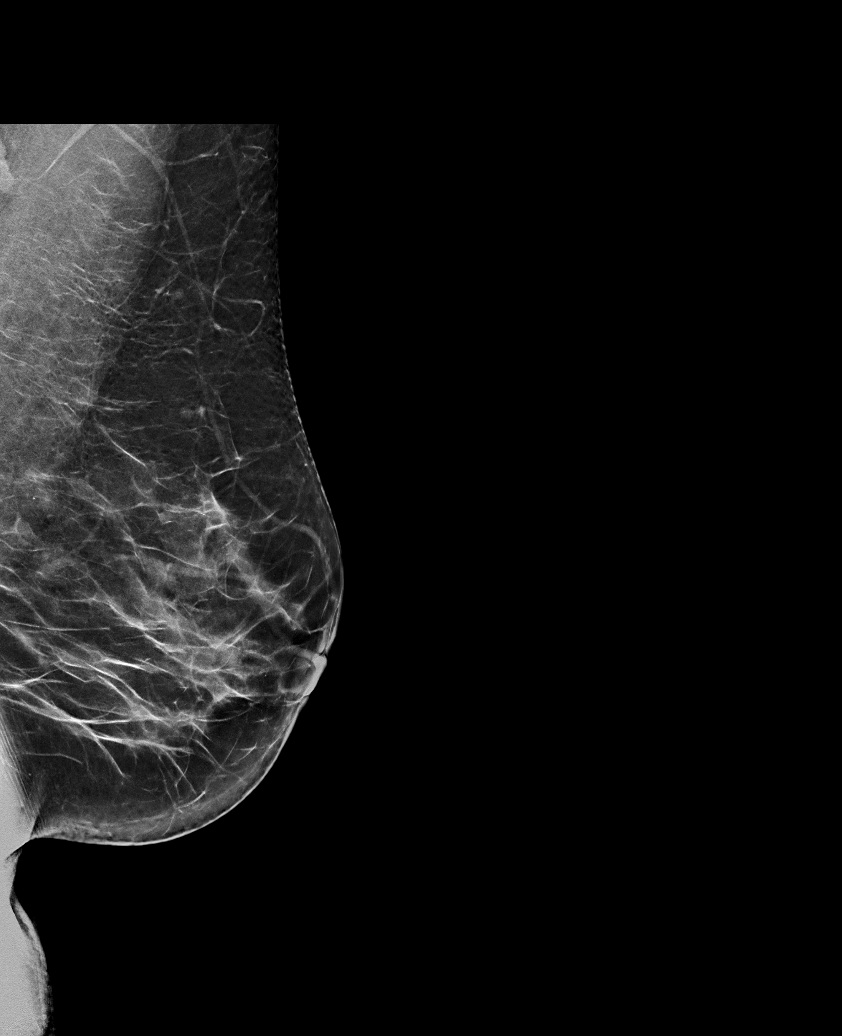

[L CC synth-2D]
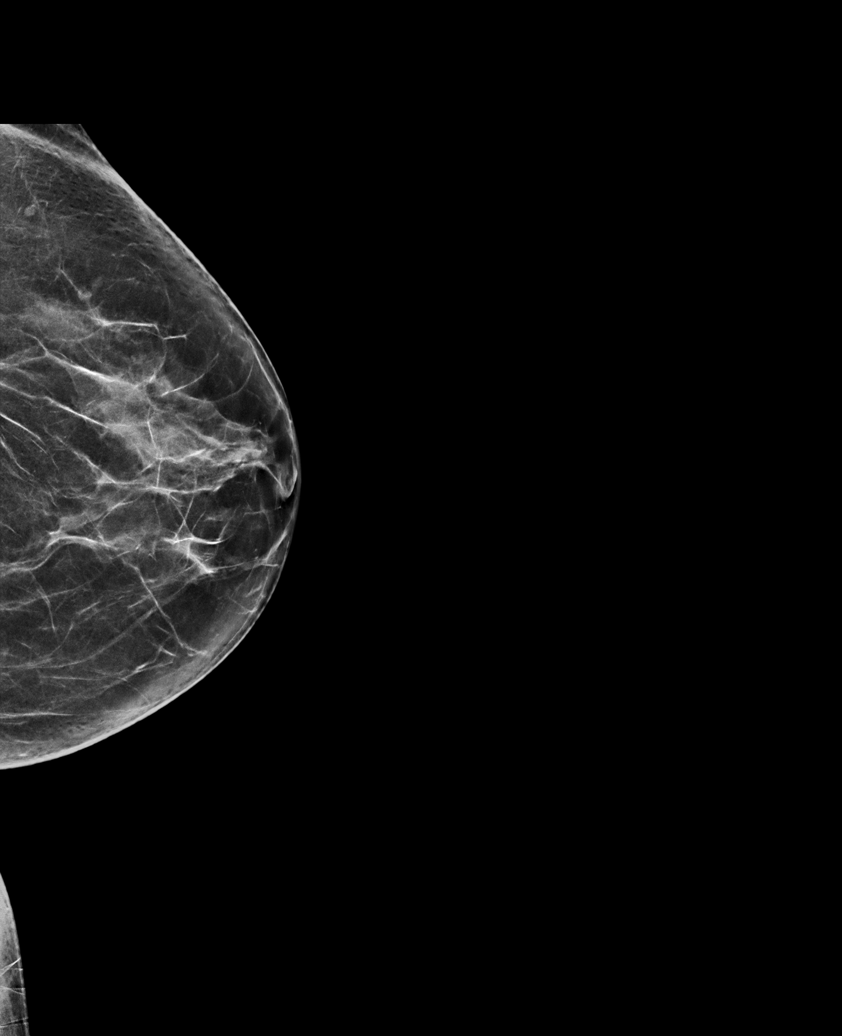

[L MLO synth-2D (2 of 2)]
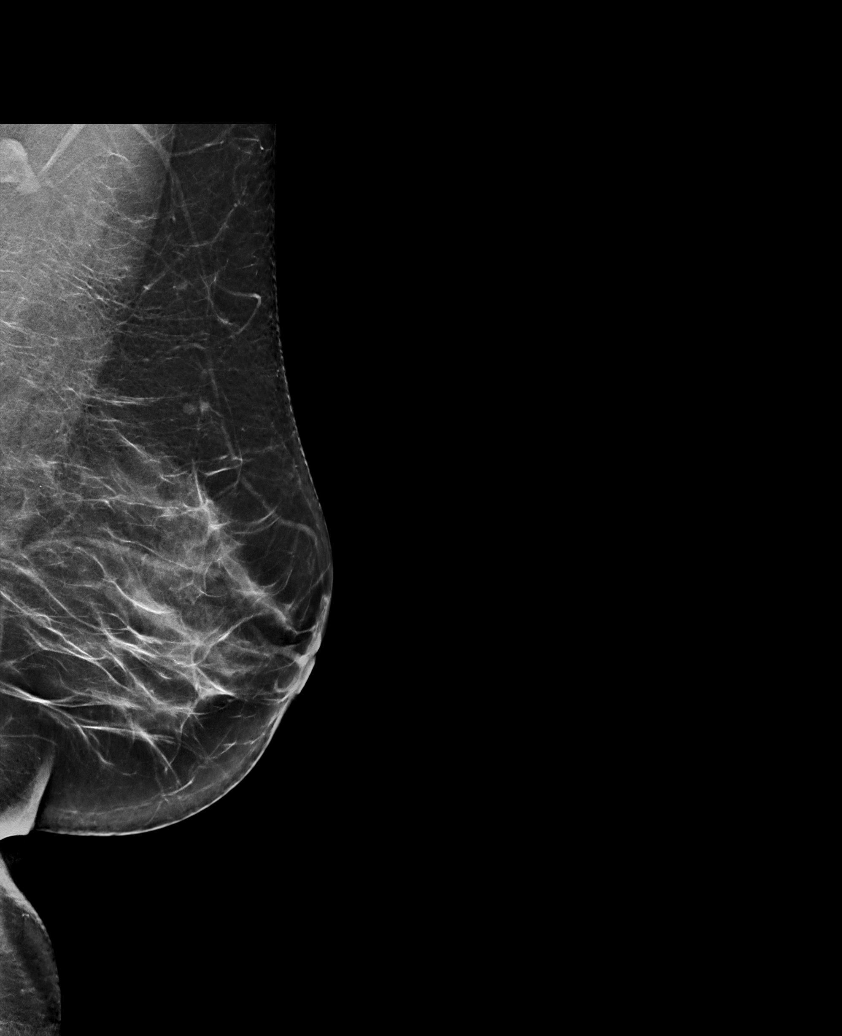

[L MLO tomo · tomo slice 35/69.0]
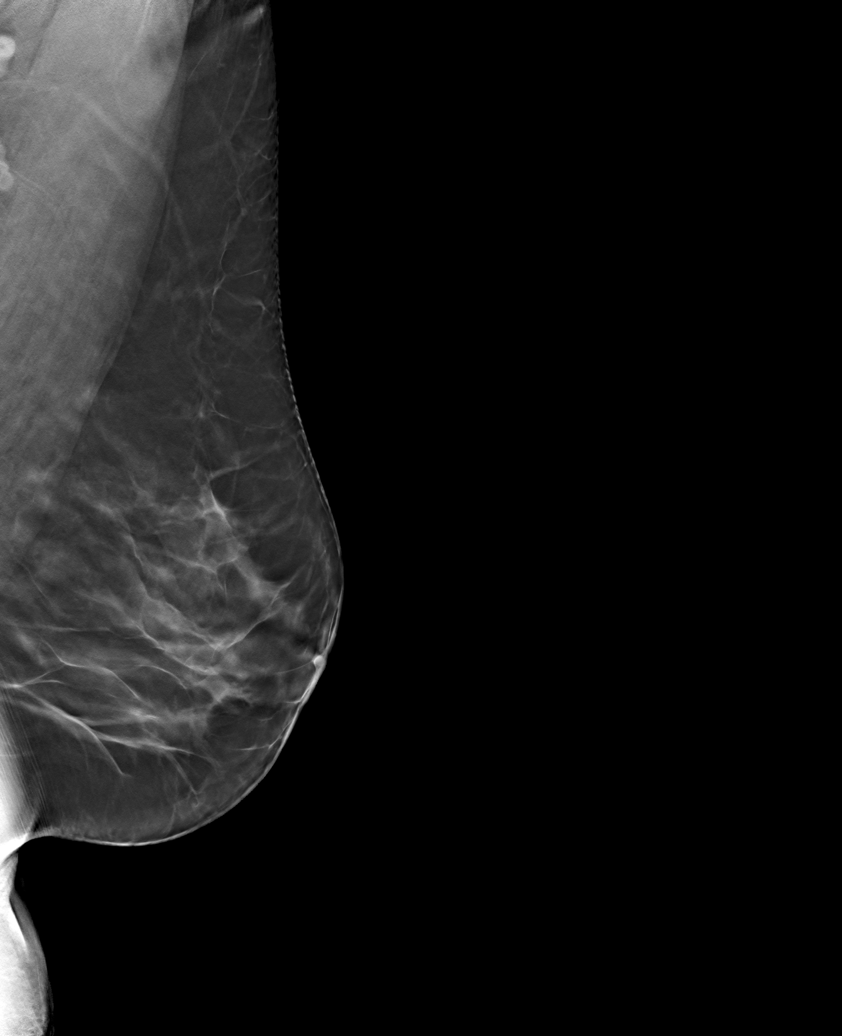

[6 of 30 positions shown; findings below may reference images not displayed]

ACR Breast Density Category b: There are scattered areas of
fibroglandular density.
FINDINGS: There are no findings suspicious for malignancy.
IMPRESSION: No mammographic evidence of malignancy. A result letter of this
screening mammogram will be mailed directly to the patient.

RECOMMENDATION:
Screening mammogram in one year. (Code:[BY])

BI-RADS CATEGORY  1: Negative.

## 2021-07-15 NOTE — Patient Instructions (Signed)
Labs this morning.  Follow up annually or sooner if needed.  Take care  Dr. Lacinda Axon

## 2021-07-15 NOTE — Progress Notes (Signed)
Subjective:  Patient ID: Shelley Macias, female    DOB: 01-Nov-1965  Age: 56 y.o. MRN: [id number]  CC: Chief Complaint  Patient presents with   Annual Exam    Needs labs for physical . Wants AIC and vitamin D levels to be done.    HPI:  56 year old female presents for annual exam.  Patient states that she is doing well.  She has recently had a breast MRI as well as a mammogram.  She is concerned about this given her family history and be BARD1 gene mutation.  She has lost some weight since her last visit.  [facility] Pap smear: Up to date. Followed by OB-GYN Mammogram: Up to date.  Colonoscopy: Up to date. Immunizations Tetanus - Up to date. Pneumococcal - Not indicated.  Flu -up-to-date. Zoster -discussed shingles vaccine today. Labs: Needs labs. Exercise: Exercises a few times per week. Alcohol use: No. Smoking/tobacco use: No.  Patient Active Problem List   Diagnosis Date Noted   Annual physical exam 07/15/2021   Obesity 06/19/2021   BARD1 gene mutation positive 05/23/2021   Family history of breast cancer 04/11/2021   Osteopenia 12/08/2019   Mixed anxiety and depressive disorder 05/02/2014   Seasonal allergies 05/02/2014    Social Hx   Social History   Socioeconomic History   Marital status: Married    Spouse name: Not on file   Number of children: Not on file   Years of education: Not on file   Highest education level: Not on file  Occupational History   Not on file  Tobacco Use   Smoking status: Never   Smokeless tobacco: Never  Substance and Sexual Activity   Alcohol use: No   Drug use: No   Sexual activity: Yes    Birth control/protection: Surgical  Other Topics Concern   Not on file  Social History Narrative   Not on file   Social Determinants of Health   Financial Resource Strain: Not on file  Food Insecurity: Not on file  Transportation Needs: Not on file  Physical Activity: Not on file  Stress: Not on file  Social  Connections: Not on file    Review of Systems  Constitutional: Negative.   Respiratory: Negative.    Cardiovascular: Negative.     Objective:  BP (!) 96/54   Pulse (!) 59   Temp 98.4 F (36.9 C) (Oral)   Ht 5' (1.524 m)   Wt 152 lb (68.9 kg)   SpO2 100%   BMI 29.69 kg/m      07/15/2021    9:09 AM 06/19/2021    9:17 AM 06/10/2021   10:21 AM  BP/Weight  Systolic BP 96 532 992  Diastolic BP 54 75 75  Wt. (Lbs) 152 157 153.9  BMI 29.69 kg/m2 30.66 kg/m2 30.06 kg/m2    Physical Exam Vitals and nursing note reviewed.  Constitutional:      General: She is not in acute distress.    Appearance: Normal appearance. She is not ill-appearing.  HENT:     Head: Normocephalic and atraumatic.     Nose: Nose normal.     Mouth/Throat:     Pharynx: Oropharynx is clear.  Eyes:     General:        Right eye: No discharge.        Left eye: No discharge.     Conjunctiva/sclera: Conjunctivae normal.  Cardiovascular:     Rate and Rhythm: Normal rate and regular rhythm.  Heart sounds: No murmur heard. Pulmonary:     Effort: Pulmonary effort is normal.     Breath sounds: Normal breath sounds. No wheezing or rales.  Abdominal:     General: There is no distension.     Palpations: Abdomen is soft.     Tenderness: There is no abdominal tenderness.  Musculoskeletal:     Cervical back: Neck supple.  Neurological:     Mental Status: She is alert.  Psychiatric:        Mood and Affect: Mood normal.        Behavior: Behavior normal.    Lab Results  Component Value Date   WBC 9.0 07/13/2020   HGB 13.9 07/13/2020   HCT 41.8 07/13/2020   PLT 244 07/13/2020   GLUCOSE 80 07/13/2020   CHOL 183 07/13/2020   TRIG 132 07/13/2020   HDL 54 07/13/2020   LDLCALC 106 (H) 07/13/2020   ALT 14 07/13/2020   AST 16 07/13/2020   NA 139 07/13/2020   K 4.6 07/13/2020   CL 105 07/13/2020   CREATININE 1.02 07/13/2020   BUN 17 07/13/2020   CO2 27 07/13/2020   TSH 1.58 07/13/2020   HGBA1C 5.3  07/13/2020     Assessment & Plan:   Problem List Items Addressed This Visit       Other   Annual physical exam - Primary    Doing well.  Labs today.  Advised to consider shingles vaccination.  Recommended regular follow-up with OB/GYN given increased risk for ovarian cancer.        [place]

## 2021-07-15 NOTE — Assessment & Plan Note (Addendum)
Doing well.  Labs today.  Advised to consider shingles vaccination.  Recommended regular follow-up with OB/GYN given increased risk for ovarian cancer.

## 2021-07-16 ENCOUNTER — Other Ambulatory Visit: Payer: Self-pay | Admitting: Hematology and Oncology

## 2021-07-16 DIAGNOSIS — R9389 Abnormal findings on diagnostic imaging of other specified body structures: Secondary | ICD-10-CM

## 2021-07-16 LAB — CBC WITH DIFFERENTIAL/PLATELET
Basophils Absolute: 0 10*3/uL (ref 0.0–0.2)
Basos: 0 %
EOS (ABSOLUTE): 0.3 10*3/uL (ref 0.0–0.4)
Eos: 5 %
Hematocrit: 42.7 % (ref 34.0–46.6)
Hemoglobin: 14.5 g/dL (ref 11.1–15.9)
Immature Grans (Abs): 0 10*3/uL (ref 0.0–0.1)
Immature Granulocytes: 0 %
Lymphocytes Absolute: 2.7 10*3/uL (ref 0.7–3.1)
Lymphs: 36 %
MCH: 31.9 pg (ref 26.6–33.0)
MCHC: 34 g/dL (ref 31.5–35.7)
MCV: 94 fL (ref 79–97)
Monocytes Absolute: 0.6 10*3/uL (ref 0.1–0.9)
Monocytes: 8 %
Neutrophils Absolute: 3.8 10*3/uL (ref 1.4–7.0)
Neutrophils: 51 %
Platelets: 258 10*3/uL (ref 150–450)
RBC: 4.54 x10E6/uL (ref 3.77–5.28)
RDW: 11.8 % (ref 11.7–15.4)
WBC: 7.4 10*3/uL (ref 3.4–10.8)

## 2021-07-16 LAB — COMPREHENSIVE METABOLIC PANEL
ALT: 16 IU/L (ref 0–32)
AST: 18 IU/L (ref 0–40)
Albumin/Globulin Ratio: 2.6 — ABNORMAL HIGH (ref 1.2–2.2)
Albumin: 4.7 g/dL (ref 3.8–4.9)
Alkaline Phosphatase: 76 IU/L (ref 44–121)
BUN/Creatinine Ratio: 16 (ref 9–23)
BUN: 14 mg/dL (ref 6–24)
Bilirubin Total: 0.6 mg/dL (ref 0.0–1.2)
CO2: 25 mmol/L (ref 20–29)
Calcium: 9.5 mg/dL (ref 8.7–10.2)
Chloride: 102 mmol/L (ref 96–106)
Creatinine, Ser: 0.9 mg/dL (ref 0.57–1.00)
Globulin, Total: 1.8 g/dL (ref 1.5–4.5)
Glucose: 89 mg/dL (ref 70–99)
Potassium: 4.6 mmol/L (ref 3.5–5.2)
Sodium: 139 mmol/L (ref 134–144)
Total Protein: 6.5 g/dL (ref 6.0–8.5)
eGFR: 75 mL/min/{1.73_m2} (ref >59)

## 2021-07-16 LAB — LIPID PANEL
Chol/HDL Ratio: 3.7 ratio (ref 0.0–4.4)
Cholesterol, Total: 198 mg/dL (ref 100–199)
HDL: 53 mg/dL (ref >39)
LDL Chol Calc (NIH): 132 mg/dL — ABNORMAL HIGH (ref 0–99)
Triglycerides: 70 mg/dL (ref 0–149)
VLDL Cholesterol Cal: 13 mg/dL (ref 5–40)

## 2021-07-16 LAB — HEMOGLOBIN A1C
Est. average glucose Bld gHb Est-mCnc: 105 mg/dL
Hgb A1c MFr Bld: 5.3 % (ref 4.8–5.6)

## 2021-07-16 LAB — VITAMIN D 25 HYDROXY (VIT D DEFICIENCY, FRACTURES): Vit D, 25-Hydroxy: 41.2 ng/mL (ref 30.0–100.0)

## 2021-07-16 LAB — TSH: TSH: 1.69 u[IU]/mL (ref 0.450–4.500)

## 2021-07-17 ENCOUNTER — Telehealth: Payer: Self-pay | Admitting: Hematology and Oncology

## 2021-07-17 NOTE — Telephone Encounter (Signed)
Scheduled appointment per 05/23 los. Patient aware.

## 2021-07-26 ENCOUNTER — Ambulatory Visit
Admission: RE | Admit: 2021-07-26 | Discharge: 2021-07-26 | Disposition: A | Discharge status: Home / Self Care | Payer: No Typology Code available for payment source | Source: Ambulatory Visit | Attending: Hematology and Oncology | Admitting: Hematology and Oncology

## 2021-07-26 ENCOUNTER — Other Ambulatory Visit (HOSPITAL_COMMUNITY): Payer: Self-pay | Admitting: Diagnostic Radiology

## 2021-07-26 DIAGNOSIS — R9389 Abnormal findings on diagnostic imaging of other specified body structures: Secondary | ICD-10-CM

## 2021-07-26 IMAGING — MG MM BREAST LOCALIZATION CLIP
4 series · 4 of 12 positions shown · non-contrast
Comparison: Previous exam(s).

CLINICAL DATA: Patient is post MRI guided core needle biopsy of an
8 mm indeterminate enhancing mass over the lower central left breast
in the middle third.

EXAM:
3D DIAGNOSTIC LEFT MAMMOGRAM POST MRI BIOPSY

[L CC synth-2D]
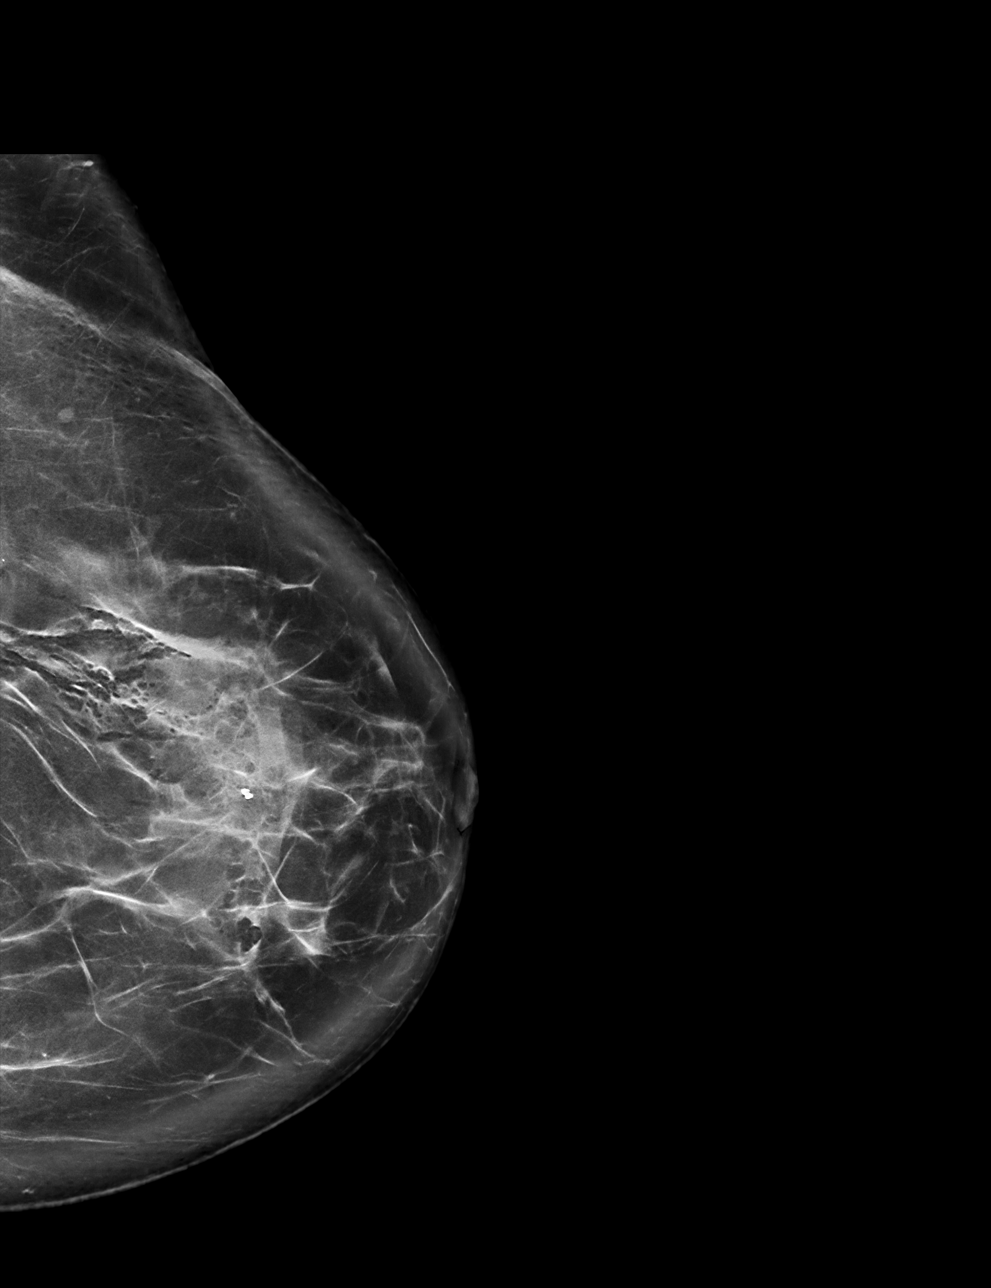

[L ML synth-2D]
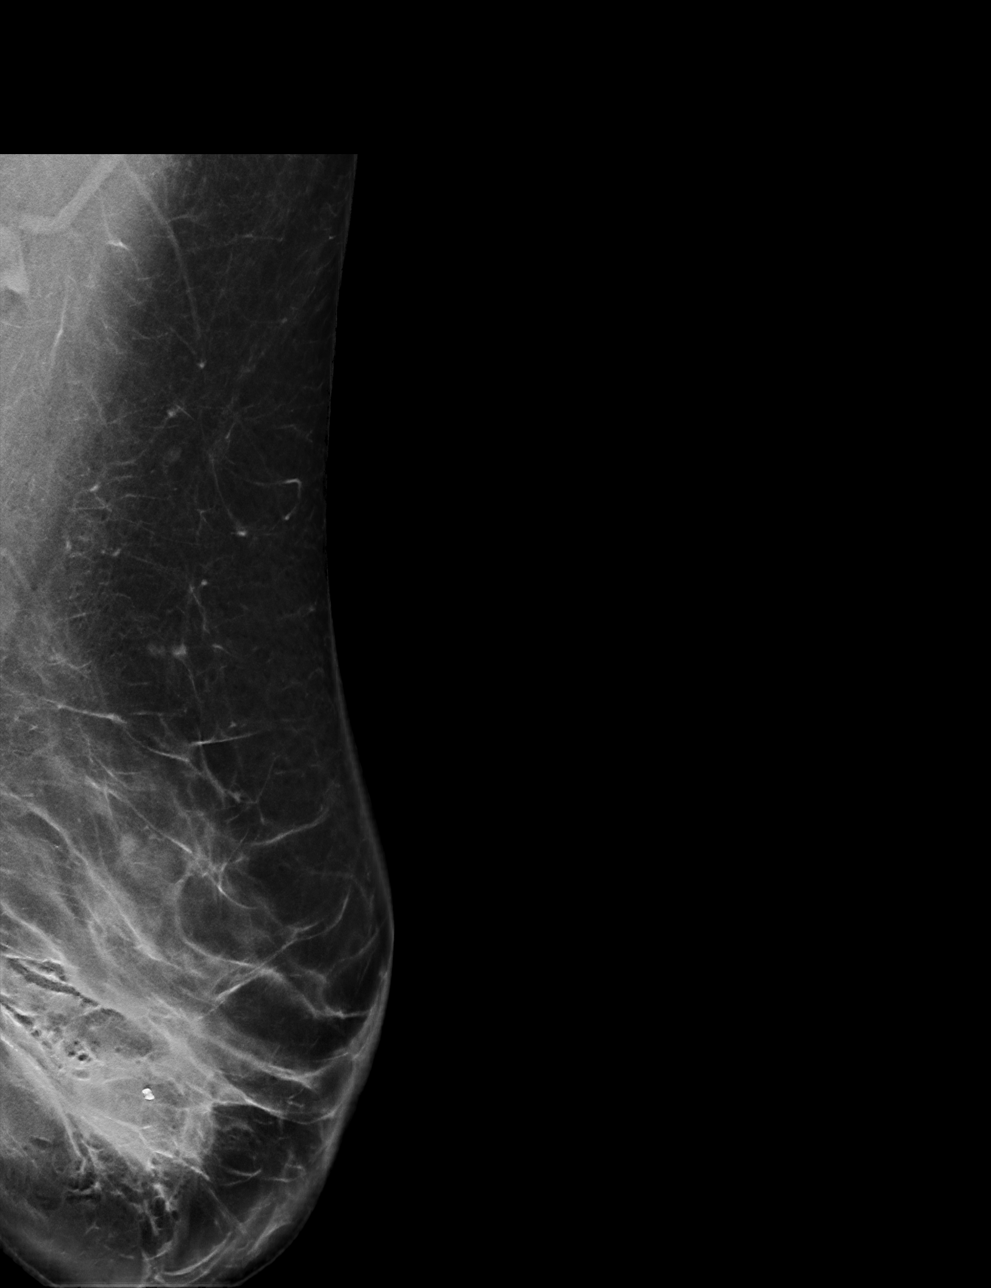

[L ML tomo · tomo slice 47/93.0]
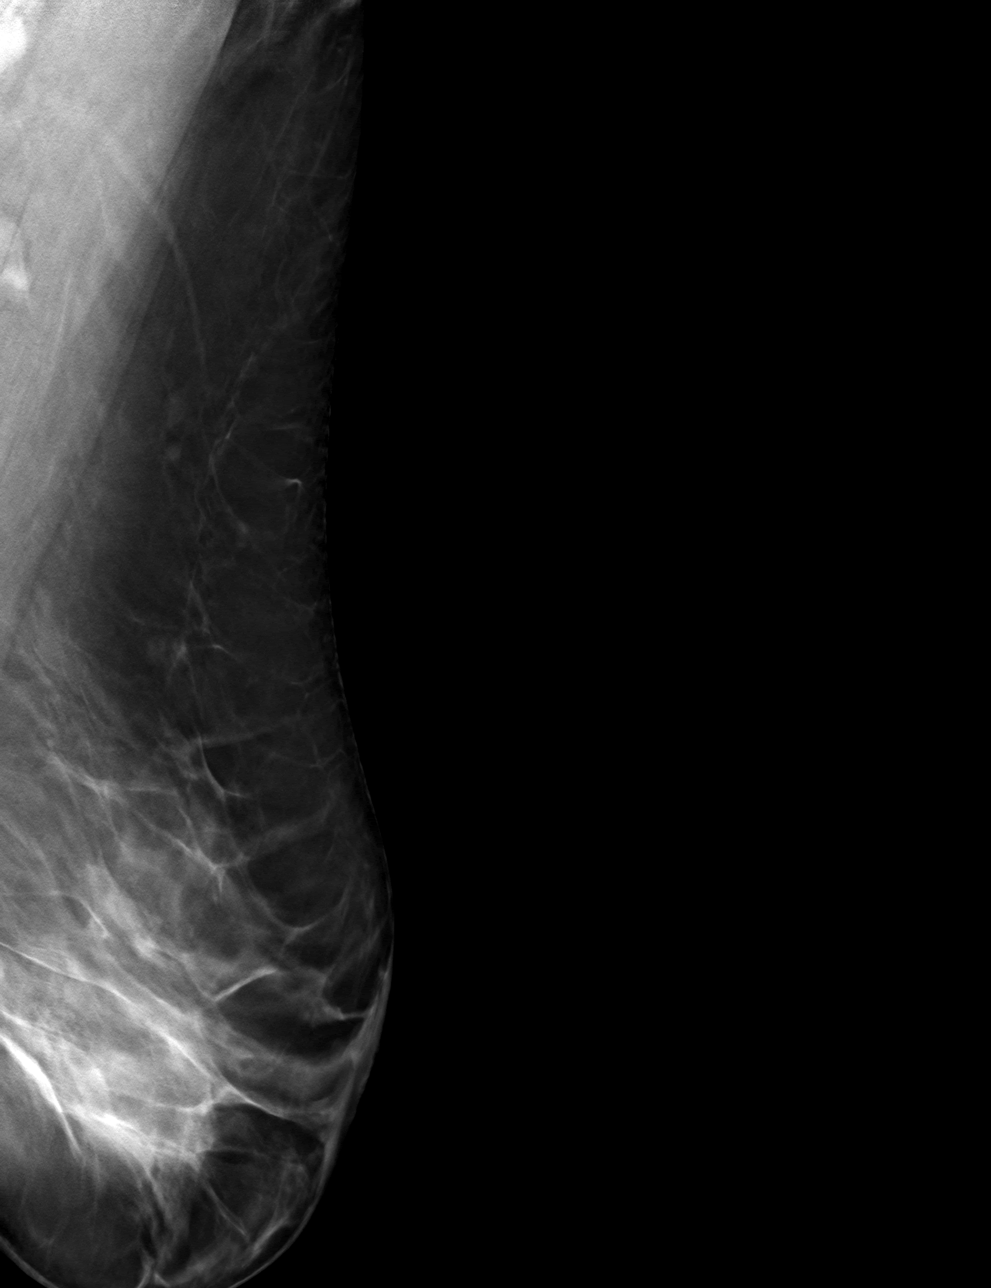

[L CC tomo · tomo slice 47/94.0]
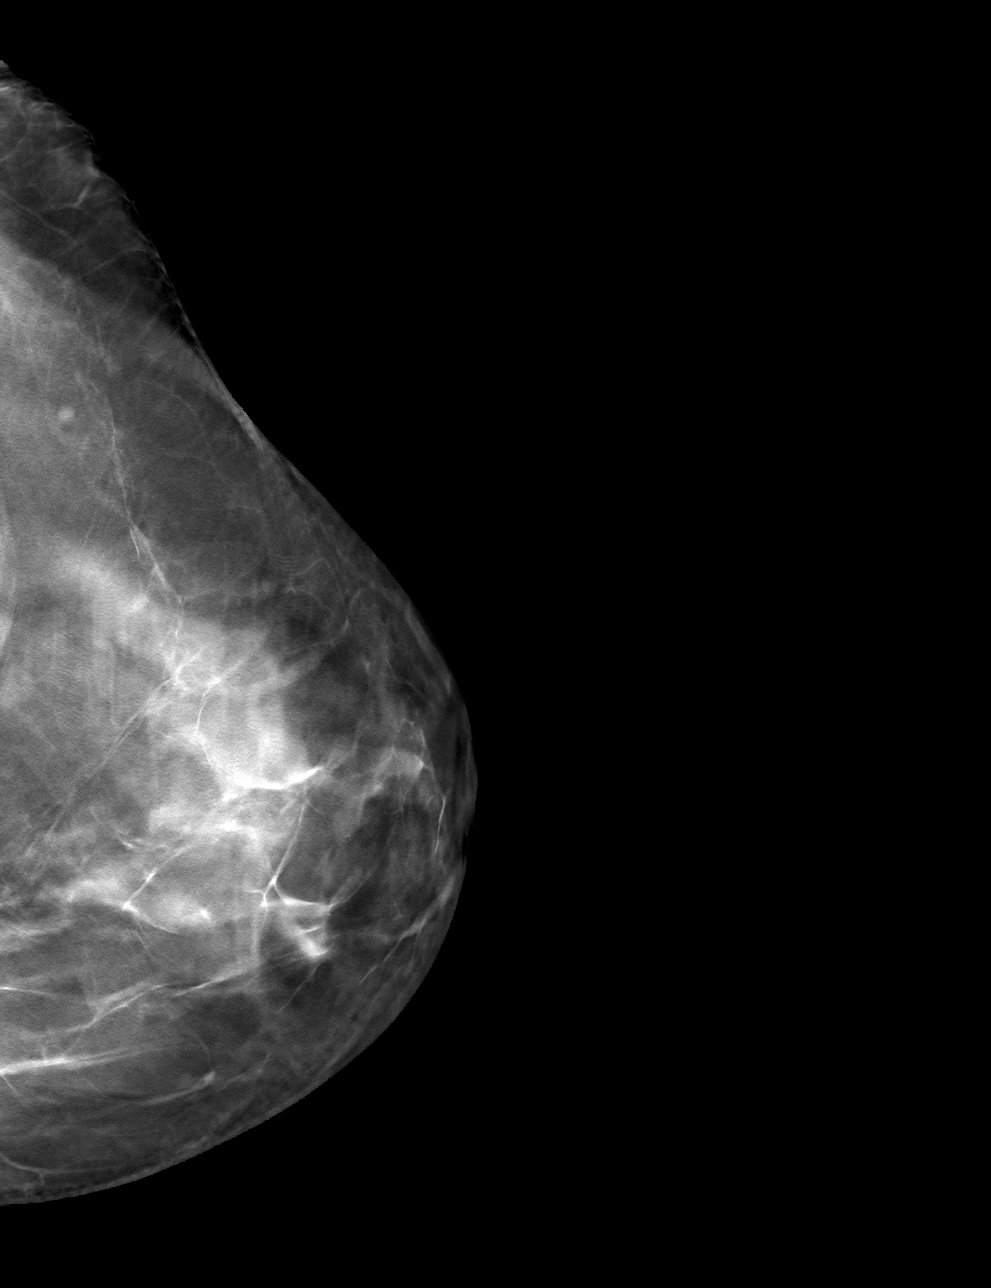

[4 of 12 positions shown; findings below may reference images not displayed]

FINDINGS: 3D Mammographic images were obtained following MRI guided biopsy of
the targeted 8 mm mass over the middle third of the lower central
left breast. The biopsy marking clip is in expected position at the
site of biopsy. Mild hemorrhagic debris at the biopsy site.
IMPRESSION: Appropriate positioning of the barbell shaped biopsy marking clip at
the site of biopsy in the lower central left breast.

Final Assessment: Post Procedure Mammograms for Marker Placement

## 2021-07-26 IMAGING — MR MR BREAST BX W LOC DEV 1ST LESION IMAGE BX SPEC MR GUIDE*L*
7 of 10 series · 33 of 48 positions shown · IV contrast (7 ml gadavist)
Comparison: Previous exams.
COMPARISON: Previous exams.

Addendum:
CLINICAL DATA: Patient presents for MRI guided biopsy of an 8 mm
oval enhancing indeterminate mass over the middle third of the lower
central left breast.

EXAM:
MRI GUIDED CORE NEEDLE BIOPSY OF THE LEFT BREAST
TECHNIQUE: Multiplanar, multisequence MR imaging of the left breast was
performed both before and after administration of intravenous
contrast.
CONTRAST:  7mL GADAVIST GADOBUTROL 1 MMOL/ML IV SOLN

[Series 2: fiducial unilateral · sagittal · 2.0mm · 1.33mm/px · 3 of 52 slices shown]
[im 1/52]
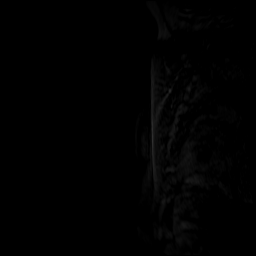
[im 26/52]
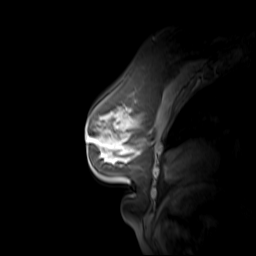
[im 52/52]
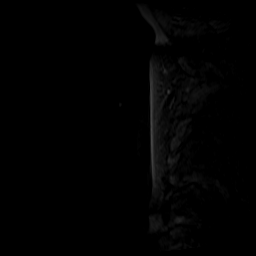

[Series 3: dynamic pre · axial · non-contrast · 1.3mm · 0.73mm/px · z∈[-89,+97]mm · 5 of 144 slices shown]
[im 1/144]
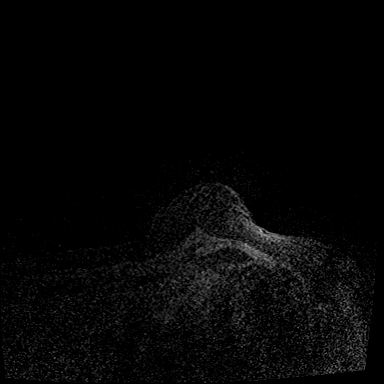
[im 36/144]
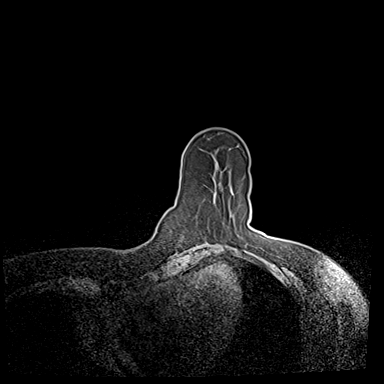
[im 72/144]
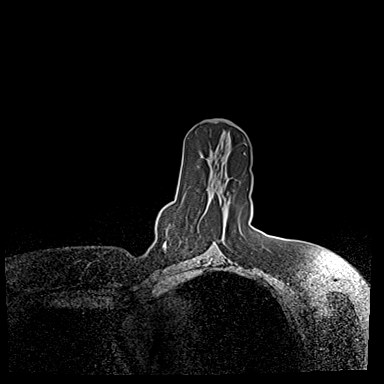
[im 108/144]
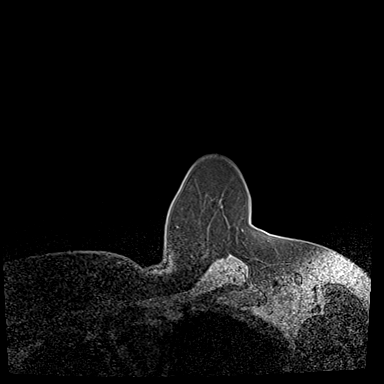
[im 144/144]
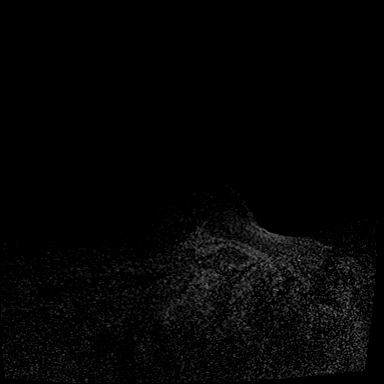

[Series 4: dynamic post 20 · axial · 1.3mm · 0.73mm/px · z∈[-89,+97]mm · 5 of 144 slices shown (1 of 2)]
[im 1/144]
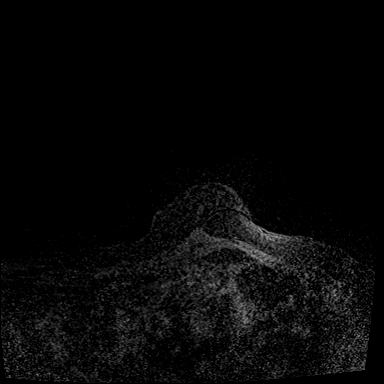
[im 36/144]
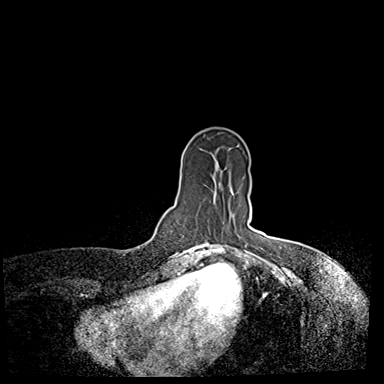
[im 72/144]
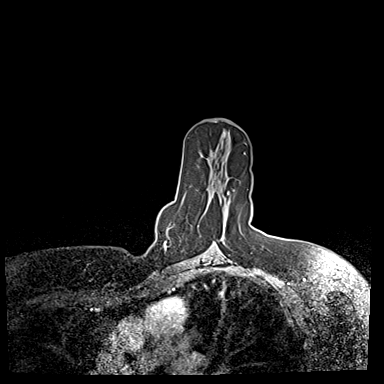
[im 108/144]
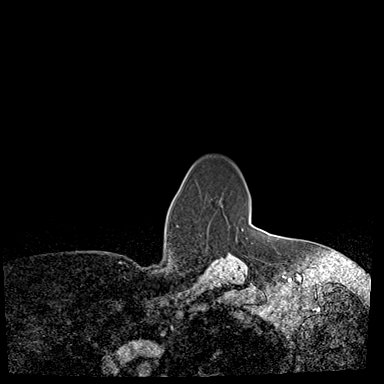
[im 144/144]
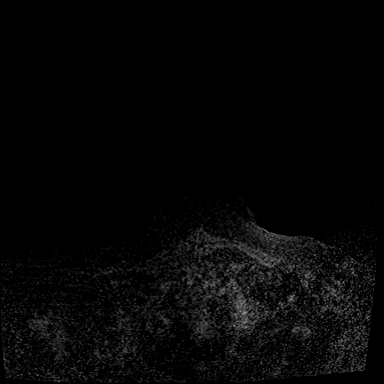

[Series 5: dynamic post 20 · axial · 1.3mm · 0.73mm/px · z∈[-89,+97]mm · 5 of 144 slices shown (2 of 2)]
[im 1/144]
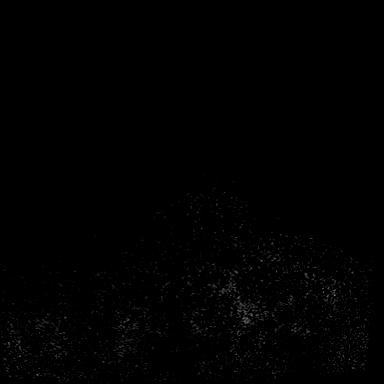
[im 36/144]
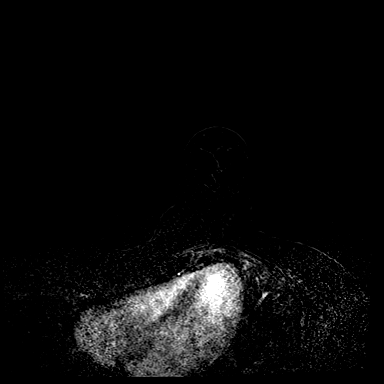
[im 72/144]
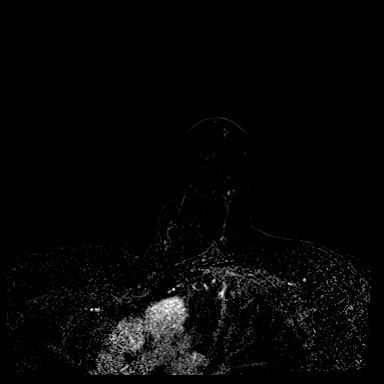
[im 108/144]
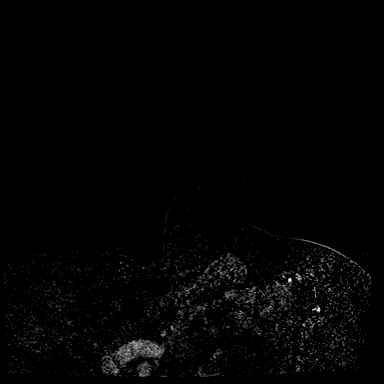
[im 144/144]
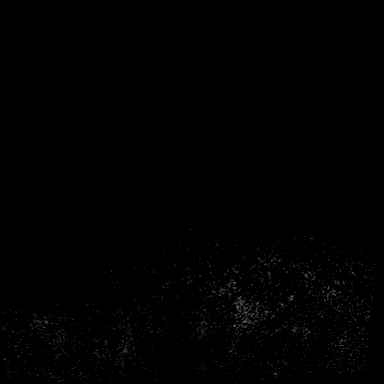

[Series 6: dynamic post 3 · axial · 1.3mm · 0.73mm/px · z∈[-89,+97]mm · 5 of 144 slices shown (1 of 2)]
[im 1/144]
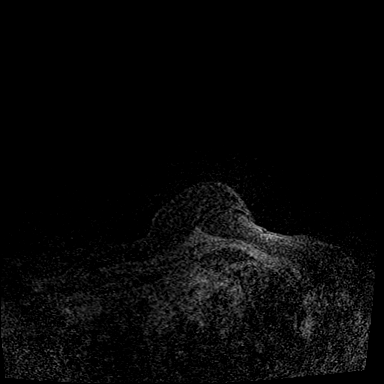
[im 36/144]
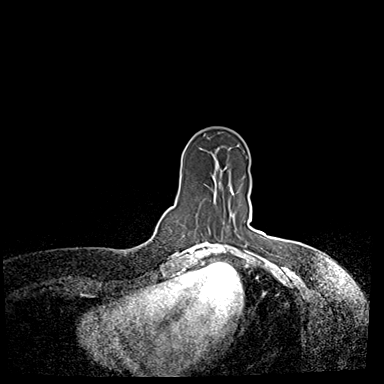
[im 72/144]
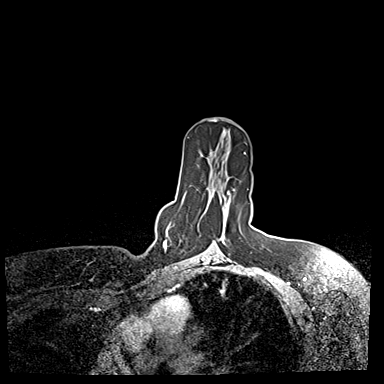
[im 108/144]
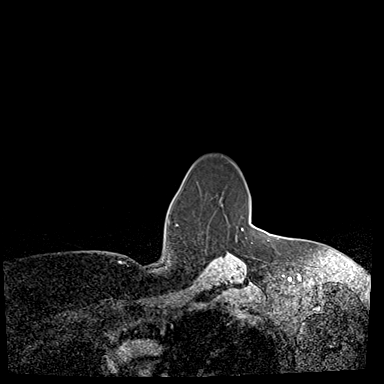
[im 144/144]
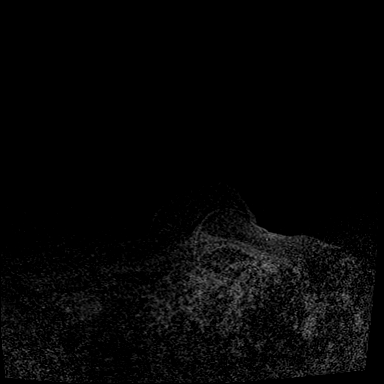

[Series 7: dynamic post 3 · axial · 1.3mm · 0.73mm/px · z∈[-89,+97]mm · 5 of 144 slices shown (2 of 2)]
[im 1/144]
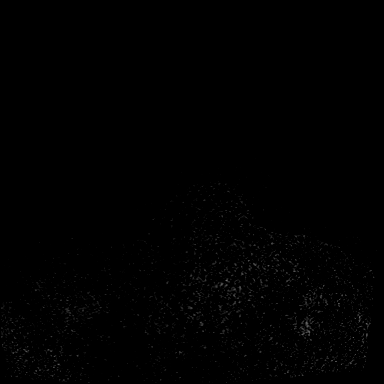
[im 36/144]
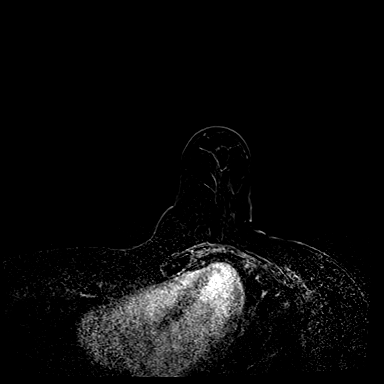
[im 72/144]
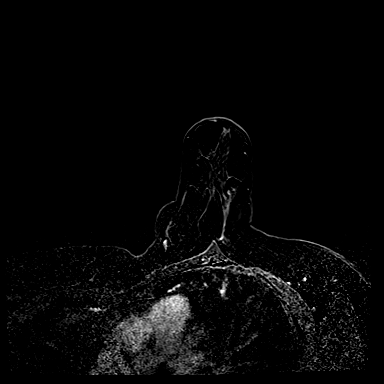
[im 108/144]
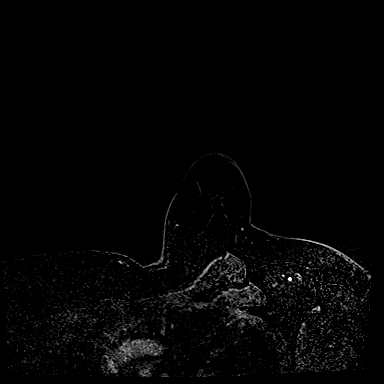
[im 144/144]
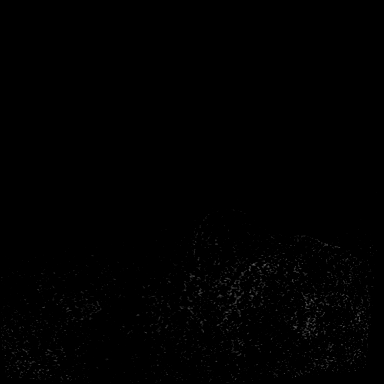

[Series 8: needle confirmation · axial · 1.3mm · 0.73mm/px · z∈[-89,+97]mm · 5 of 144 slices shown]
[im 1/144]
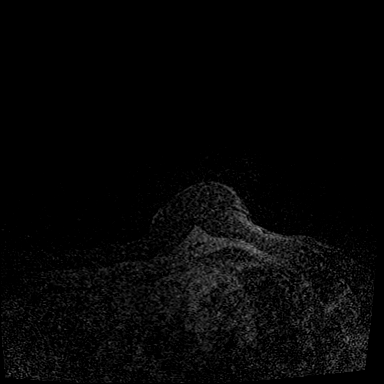
[im 36/144]
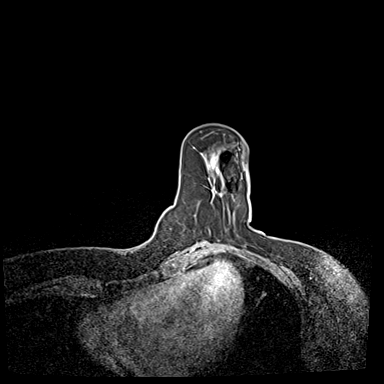
[im 72/144]
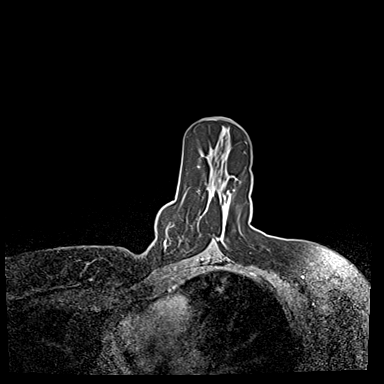
[im 108/144]
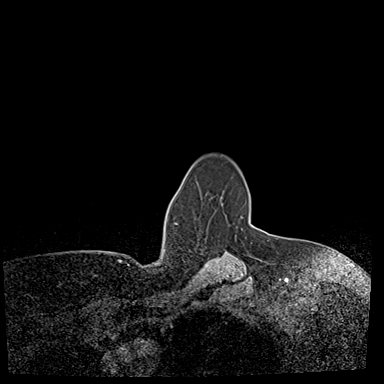
[im 144/144]
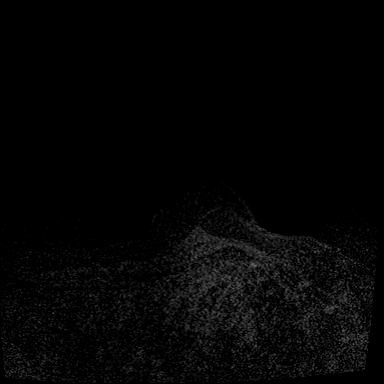

[33 of 48 positions shown; findings below may reference images not displayed]

FINDINGS: I met with the patient, and we discussed the procedure of MRI guided
biopsy, including risks, benefits, and alternatives. Specifically,
we discussed the risks of infection, bleeding, tissue injury, clip
migration, and inadequate sampling. Informed, written consent was
given. The usual time out protocol was performed immediately prior
to the procedure.

Using sterile technique, 1% Lidocaine, MRI guidance, and a 9 gauge
vacuum assisted device, biopsy was performed of the targeted 8 mm
mass over the lower central left breast using a lateral to medial
approach. At the conclusion of the procedure, a barbell tissue
marker clip was deployed into the biopsy cavity. A hematoma is noted
at the biopsy cavity the post biopsy images measuring approximately
2.6 cm. Follow-up 2-view mammogram was performed and dictated
separately.
IMPRESSION: MRI guided biopsy of an indeterminate 8 mm left breast mass. No
apparent complications.

ADDENDUM:
Pathology revealed Breast, LEFT, needle core biopsy, lower central-
FIBROCYSTIC CHANGE WITH FOCAL USUAL DUCTAL HYPERPLASIA- NEGATIVE FOR
CARCINOMA (barbell clip). This was found to be concordant by Dr.
ELE.

Pathology results were discussed with the patient by telephone. The
patient reported doing well after the biopsy with tenderness at the
site. Post biopsy instructions and care were reviewed and questions
were answered. The patient was encouraged to call The [REDACTED]

Bilateral breast MRI recommended in 6 months per protocol.

Pathology results reported by ELE RN on [DATE].

*** End of Addendum ***
FINDINGS: I met with the patient, and we discussed the procedure of MRI guided
biopsy, including risks, benefits, and alternatives. Specifically,
we discussed the risks of infection, bleeding, tissue injury, clip
migration, and inadequate sampling. Informed, written consent was
given. The usual time out protocol was performed immediately prior
to the procedure.

Using sterile technique, 1% Lidocaine, MRI guidance, and a 9 gauge
vacuum assisted device, biopsy was performed of the targeted 8 mm
mass over the lower central left breast using a lateral to medial
approach. At the conclusion of the procedure, a barbell tissue
marker clip was deployed into the biopsy cavity. A hematoma is noted
at the biopsy cavity the post biopsy images measuring approximately
2.6 cm. Follow-up 2-view mammogram was performed and dictated
separately.
IMPRESSION: MRI guided biopsy of an indeterminate 8 mm left breast mass. No
apparent complications.

## 2021-07-26 MED ORDER — GADOBUTROL 1 MMOL/ML IV SOLN
7.0000 mL | Freq: Once | INTRAVENOUS | Status: AC | PRN
  Administered 2021-07-26: 7 mL via INTRAVENOUS

## 2021-08-20 ENCOUNTER — Encounter: Payer: Self-pay | Admitting: Family Medicine

## 2021-08-20 ENCOUNTER — Other Ambulatory Visit (HOSPITAL_COMMUNITY): Payer: Self-pay

## 2021-08-20 ENCOUNTER — Other Ambulatory Visit: Payer: Self-pay | Admitting: Family Medicine

## 2021-08-20 MED ORDER — SEMAGLUTIDE-WEIGHT MANAGEMENT 0.25 MG/0.5ML ~~LOC~~ SOAJ
0.2500 mg | SUBCUTANEOUS | 0 refills | Status: DC
  Filled 2021-08-20: qty 2, fill #0

## 2021-08-21 MED ORDER — SEMAGLUTIDE-WEIGHT MANAGEMENT 0.25 MG/0.5ML ~~LOC~~ SOAJ: 0.2500 mg | SUBCUTANEOUS | 0 refills | Status: DC

## 2021-08-23 ENCOUNTER — Encounter: Payer: Self-pay | Admitting: Hematology and Oncology

## 2021-08-23 ENCOUNTER — Other Ambulatory Visit: Payer: Self-pay

## 2021-08-23 ENCOUNTER — Inpatient Hospital Stay: Payer: No Typology Code available for payment source | Attending: Oncology | Admitting: Hematology and Oncology

## 2021-08-23 VITALS — BP 122/81 | HR 82 | Temp 98.9°F | Resp 16 | Ht 60.0 in | Wt 150.5 lb

## 2021-08-23 DIAGNOSIS — Z1501 Genetic susceptibility to malignant neoplasm of breast: Secondary | ICD-10-CM | POA: Insufficient documentation

## 2021-08-23 DIAGNOSIS — Z803 Family history of malignant neoplasm of breast: Secondary | ICD-10-CM | POA: Insufficient documentation

## 2021-08-23 NOTE — Progress Notes (Signed)
Shelley Macias  Patient Care Team: Coral Spikes, DO as PCP - General (Family Medicine) Royston Sinner Colin Benton, MD as Consulting Physician (Obstetrics and Gynecology)  CHIEF COMPLAINTS/PURPOSE OF CONSULTATION:  At high risk for breast cancer  ASSESSMENT & PLAN:   This is a very pleasant 56 year old female patient with no significant past medical history referred to high risk breast clinic because of pathogenic BARD 1 mutation.  BRCA1 associated RING domain 1 also called BARD1 predisposes patients to increased risk of breast cancer and possibly ovarian cancer as well.  The risk of breast cancer is gauged at approximately 20 to 40%.  It appears that it may be associated with the ER negative breast cancer than for ER positive breast cancer.  We generally recommend mammograms at age 43 along with consideration of breast MRI with contrast for patients with this mutation.  There is no recommendations to support for risk reduction mastectomy.  We have discussed unknown long-term risk of gadolinium deposition and increased sensitivity with MRIs that may lead to more number of biopsies.  Since last visit she had a mammogram and an MRI, had an MRI guided biopsy for an indeterminate finding in the left breast, biopsy findings showed benign fibrocystic changes. She has not seen her gynecologist since her last visit.  I have once again encouraged her to follow-up with her gynecologist for possibly ovarian cancer screening.  MRI in 6 months was recommended per protocol, this was ordered.  Physical examination today without any new or concerning changes except for postbiopsy changes. I have encouraged her to continue self breast exam and applauded on the regular exercise and dietary changes that she has made.  She will return to clinic in 6 months or sooner as needed.  HISTORY OF PRESENTING ILLNESS:  Shelley Macias 56 y.o. female is here because of BARD 1  This is a very pleasant  56 year old female patient with no significant past medical history referred to high risk breast clinic because of pathogenic BARD 1 mutation.    Interval history She is here for follow-up.  She is doing quite well except for some postbiopsy hematoma in the left breast.  This has also since healed.  She denies any other new changes.  She is hoping to go back to the beach this weekend.  She has not followed up with her gynecologist since her last appointment with [place]. Rest of the pertinent 10 point ROS reviewed and negative  MEDICAL HISTORY:  Past Medical History:  Diagnosis Date   Allergy    Anxiety    Depression    [employer]reesia 07/18/2019   Esophageal ulcer    Family history of breast cancer    Family history of breast cancer gene mutation in first degree relative    GERD (gastroesophageal reflux disease)    Seasonal allergies    Urinary tract infection     SURGICAL HISTORY: Past Surgical History:  Procedure Laterality Date   [place] N/A    [employer]ia 07/18/2019   COLONOSCOPY N/A 10/25/2014   Procedure: COLONOSCOPY;  Surgeon: Rogene Houston, MD;  [facility];  Service: Endoscopy;  Laterality: N/A;  1030 -- cancel order for TCS   ESOPHAGOGASTRODUODENOSCOPY     ESOPHAGOGASTRODUODENOSCOPY N/A 10/25/2014   Procedure: ESOPHAGOGASTRODUODENOSCOPY (EGD);  Surgeon: Rogene Houston, MD;  Location: [facility];  Service: Endoscopy;  Laterality: N/A;    SOCIAL HISTORY: Social  History   Socioeconomic History   Marital status: Married    Spouse name: Not on file   Number of children: Not on file   Years of education: Not on file   Highest education level: Not on file  Occupational History   Not on file  Tobacco Use   Smoking status: Never   Smokeless tobacco: Never  Substance and Sexual Activity   Alcohol use: No   Drug use: No   Sexual activity: Yes    Birth control/protection: Surgical  Other Topics Concern    Not on file  Social History Narrative   Not on file   Social Determinants of Health   Financial Resource Strain: Not on file  Food Insecurity: Not on file  Transportation Needs: Not on file  Physical Activity: Not on file  Stress: Not on file  Social Connections: Not on file  Intimate Partner Violence: Not on file    FAMILY HISTORY: Family History  Problem Relation Age of Onset   Hearing loss Mother    Dementia Mother    Stroke Mother    Hypertension Mother    Breast cancer Mother 19       BARD1+   Heart disease Father        A- fib   Hyperlipidemia Father    Hypertension Father    Diabetes Father    Cancer Father        Basal cell   Mental illness Sister    Learning disabilities Sister    Cancer Sister        BILE DUCT d, 95   Diabetes Brother    Hypertension Brother    Vision loss Brother    Arthritis Brother    Hypertension Brother    Breast cancer Maternal Aunt        d. 36s   Lung cancer Cousin        Stage IV, never smoker, think secondary to Radon    ALLERGIES:  is allergic to demerol [meperidine], other, and penicillins.  MEDICATIONS:  Current Outpatient Medications  Medication Sig Dispense Refill   buPROPion (WELLBUTRIN XL) 150 MG 24 hr tablet Take 1 tablet (150 mg total) by mouth daily. 90 tablet 1   citalopram (CELEXA) 40 MG tablet Take 1 tablet (40 mg total) by mouth daily. 90 tablet 1   magnesium oxide (MAG-OX) 400 MG tablet Take 400 mg by mouth daily.     Multiple Vitamin (MULTIVITAMIN WITH MINERALS) TABS tablet Take by mouth daily.     Semaglutide-Weight Management 0.25 MG/0.5ML SOAJ Inject 0.25 mg into the skin once a week. 2 mL 0   No current facility-administered medications for this visit.     PHYSICAL EXAMINATION: ECOG PERFORMANCE STATUS: 0 - Asymptomatic  Vitals:   08/23/21 1325  BP: 122/81  Pulse: 82  Resp: 16  Temp: 98.9 F (37.2 C)  SpO2: 100%   Filed Weights   08/23/21 1325  Weight: 150 lb 8 oz (68.3 kg)     Physical Exam Constitutional:      Appearance: Normal appearance.  Cardiovascular:     Rate and Rhythm: Normal rate and regular rhythm.     Pulses: Normal pulses.     Heart sounds: Normal heart sounds.  Pulmonary:     Effort: Pulmonary effort is normal.     Breath sounds: Normal breath sounds.  Chest:     Comments: Bilateral breasts inspected.  No palpable masses or regional adenopathy Abdominal:     General: Abdomen  is flat. Bowel sounds are normal.     Palpations: Abdomen is soft.  Musculoskeletal:        General: No swelling or tenderness. Normal range of motion.     Cervical back: Normal range of motion and neck supple. No rigidity.  Lymphadenopathy:     Cervical: No cervical adenopathy.  Skin:    General: Skin is warm and dry.  Neurological:     General: No focal deficit present.     Mental Status: She is alert.      LABORATORY DATA:  I have reviewed the data as listed Lab Results  Component Value Date   WBC 7.4 07/15/2021   HGB 14.5 07/15/2021   HCT 42.7 07/15/2021   MCV 94 07/15/2021   PLT 258 07/15/2021     Chemistry      Component Value Date/Time   NA 139 07/15/2021 0954   K 4.6 07/15/2021 0954   CL 102 07/15/2021 0954   CO2 25 07/15/2021 0954   BUN 14 07/15/2021 0954   CREATININE 0.90 07/15/2021 0954   CREATININE 1.02 07/13/2020 1548      Component Value Date/Time   CALCIUM 9.5 07/15/2021 0954   ALKPHOS 76 07/15/2021 0954   AST 18 07/15/2021 0954   ALT 16 07/15/2021 0954   BILITOT 0.6 07/15/2021 0954       RADIOGRAPHIC STUDIES: I have personally reviewed the radiological images as listed and agreed with the findings in the report. MR LT BREAST BX W LOC DEV 1ST LESION IMAGE BX SPEC MR GUIDE  Addendum Date: 08/06/2021   ADDENDUM REPORT: 08/06/2021 08:28 ADDENDUM: Pathology revealed Breast, LEFT, needle core biopsy, lower central- FIBROCYSTIC CHANGE WITH FOCAL USUAL DUCTAL HYPERPLASIA- NEGATIVE FOR CARCINOMA (barbell clip). This was found  to be concordant by Dr. Marin Olp. Pathology results were discussed with the patient by telephone. The patient reported doing well after the biopsy with tenderness at the site. Post biopsy instructions and care were reviewed and questions were answered. The patient was encouraged to call [facility] for any additional concerns. Bilateral breast MRI recommended in 6 months per protocol. Pathology results reported by Stacie Acres RN on 08/02/2021. Electronically Signed   By: Marin Olp M.D.   On: 08/06/2021 08:28   Result Date: 08/06/2021 CLINICAL DATA:  Patient presents for MRI guided biopsy of an 8 mm oval enhancing indeterminate mass over the middle third of the lower central left breast. EXAM: MRI GUIDED CORE NEEDLE BIOPSY OF THE LEFT BREAST TECHNIQUE: Multiplanar, multisequence MR imaging of the left breast was performed both before and after administration of intravenous contrast. CONTRAST:  67m GADAVIST GADOBUTROL 1 MMOL/ML IV SOLN COMPARISON:  Previous exams. FINDINGS: I met with the patient, and we discussed the procedure of MRI guided biopsy, including risks, benefits, and alternatives. Specifically, we discussed the risks of infection, bleeding, tissue injury, clip migration, and inadequate sampling. Informed, written consent was given. The usual time out protocol was performed immediately prior to the procedure. Using sterile technique, 1% Lidocaine, MRI guidance, and a 9 gauge vacuum assisted device, biopsy was performed of the targeted 8 mm mass over the lower central left breast using a lateral to medial approach. At the conclusion of the procedure, a barbell tissue marker clip was deployed into the biopsy cavity. A hematoma is noted at the biopsy cavity the post biopsy images measuring approximately 2.6 cm. Follow-up 2-view mammogram was performed and dictated separately. IMPRESSION: MRI guided biopsy of an indeterminate 8  mm left breast mass. No apparent complications.  Electronically Signed: By: Marin Olp M.D. On: 07/26/2021 08:35  MM CLIP PLACEMENT LEFT  Result Date: 07/26/2021 CLINICAL DATA:  Patient is post MRI guided core needle biopsy of an 8 mm indeterminate enhancing mass over the lower central left breast in the middle third. EXAM: 3D DIAGNOSTIC LEFT MAMMOGRAM POST MRI BIOPSY COMPARISON:  Previous exam(s). FINDINGS: 3D Mammographic images were obtained following MRI guided biopsy of the targeted 8 mm mass over the middle third of the lower central left breast. The biopsy marking clip is in expected position at the site of biopsy. Mild hemorrhagic debris at the biopsy site. IMPRESSION: Appropriate positioning of the barbell shaped biopsy marking clip at the site of biopsy in the lower central left breast. Final Assessment: Post Procedure Mammograms for Marker Placement Electronically Signed   By: Marin Olp M.D.   On: 07/26/2021 10:13   All questions were answered. The patient knows to call the clinic with any problems, questions or concerns. I spent 30 minutes in the care of this patient including H and P, review of records, counseling and coordination of care.     Benay Pike, MD 08/23/2021 1:37 PM

## 2021-08-26 ENCOUNTER — Telehealth: Payer: Self-pay | Admitting: Family Medicine

## 2021-08-26 ENCOUNTER — Telehealth: Payer: Self-pay | Admitting: *Deleted

## 2021-08-26 NOTE — Telephone Encounter (Signed)
PA from insurance denied [facility]. Medication was denied because BMI was not over 30- current BMI 29.69. Patient  notified.

## 2021-08-26 NOTE — Telephone Encounter (Signed)
She sees Dr. Lacinda Axon and already establish with him and now wants father Fatima Sanger to be a patient here but with you explained to her you are not taking new patient only Dr. Thersa Salt and [facility] are. Her number is [phone].

## 2021-08-29 NOTE — Telephone Encounter (Signed)
Hi Erica I discussed this with Kieth Brightly.  She would like for her dad to be established with me I will be more than happy to see him in July it would be fine to utilize on 1120 or 1140 slot the week of July 10 or July 17-thanks-Dr. Donyale Falcon Please set up appointment and call penny thank you

## 2021-09-17 ENCOUNTER — Other Ambulatory Visit (HOSPITAL_COMMUNITY): Payer: Self-pay

## 2021-12-10 ENCOUNTER — Ambulatory Visit: Payer: Self-pay | Admitting: Hematology and Oncology

## 2021-12-12 ENCOUNTER — Ambulatory Visit: Payer: Self-pay | Admitting: Hematology and Oncology

## 2022-01-04 ENCOUNTER — Encounter (INDEPENDENT_AMBULATORY_CARE_PROVIDER_SITE_OTHER): Payer: Self-pay | Admitting: Gastroenterology

## 2022-01-29 ENCOUNTER — Other Ambulatory Visit: Payer: Self-pay | Admitting: Nurse Practitioner

## 2022-01-29 ENCOUNTER — Other Ambulatory Visit (HOSPITAL_COMMUNITY): Payer: Self-pay

## 2022-02-19 ENCOUNTER — Ambulatory Visit (HOSPITAL_COMMUNITY): Admission: RE | Admit: 2022-02-19 | Payer: No Typology Code available for payment source | Source: Ambulatory Visit

## 2022-02-21 ENCOUNTER — Ambulatory Visit (HOSPITAL_COMMUNITY)
Admission: RE | Admit: 2022-02-21 | Discharge: 2022-02-21 | Disposition: A | Discharge status: Home / Self Care | Payer: No Typology Code available for payment source | Source: Ambulatory Visit | Attending: Hematology and Oncology | Admitting: Hematology and Oncology

## 2022-02-21 DIAGNOSIS — Z1501 Genetic susceptibility to malignant neoplasm of breast: Secondary | ICD-10-CM | POA: Diagnosis present

## 2022-02-21 MED ORDER — GADOBUTROL 1 MMOL/ML IV SOLN
6.5000 mL | Freq: Once | INTRAVENOUS | Status: AC | PRN
  Administered 2022-02-21: 6.5 mL via INTRAVENOUS

## 2022-02-25 ENCOUNTER — Inpatient Hospital Stay: Payer: Commercial Managed Care - PPO | Attending: Hematology and Oncology | Admitting: Hematology and Oncology

## 2022-02-25 ENCOUNTER — Other Ambulatory Visit: Payer: Self-pay

## 2022-02-25 ENCOUNTER — Encounter: Payer: Self-pay | Admitting: Hematology and Oncology

## 2022-02-25 VITALS — BP 128/70 | HR 60 | Temp 97.6°F | Resp 16 | Ht 60.0 in | Wt 161.5 lb

## 2022-02-25 DIAGNOSIS — Z803 Family history of malignant neoplasm of breast: Secondary | ICD-10-CM | POA: Diagnosis not present

## 2022-02-25 DIAGNOSIS — Z8481 Family history of carrier of genetic disease: Secondary | ICD-10-CM

## 2022-02-25 DIAGNOSIS — Z1501 Genetic susceptibility to malignant neoplasm of breast: Secondary | ICD-10-CM | POA: Insufficient documentation

## 2022-02-25 NOTE — Progress Notes (Signed)
[facility] NOTE  Patient Care Team: Coral Spikes, DO as PCP - General (Family Medicine) Royston Sinner Colin Benton, MD as Consulting Physician (Obstetrics and Gynecology)  CHIEF COMPLAINTS/PURPOSE OF CONSULTATION:  At high risk for breast cancer  ASSESSMENT & PLAN:   This is a very pleasant 57 year old female patient with no significant past medical history referred to high risk breast clinic because of pathogenic BARD 1 mutation.  BRCA1 associated RING domain 1 also called BARD1 predisposes patients to increased risk of breast cancer and possibly ovarian cancer as well.  The risk of breast cancer is gauged at approximately 20 to 40%.  It appears that it may be associated with the ER negative breast cancer than for ER positive breast cancer.  We generally recommend mammograms at age 24 along with consideration of breast MRI with contrast for patients with this mutation.  There is no recommendations to support for risk reduction mastectomy.  We have discussed unknown long-term risk of gadolinium deposition and increased sensitivity with MRIs that may lead to more number of biopsies.  Since last visit she had an MRI on 12/29. She denies any new changes in her breast. Physical exam unremarkable, scattered density in the breast.  No obvious palpable masses or regional adenopathy. She continues to do change in her lifestyle, eating healthier, no alcohol, has been exercising regularly, going to [employer]. At this point she wants to continue annual mammograms if MRI from December appears well.  She will continue to alternate for breast exams with [place] as well as GYN.   HISTORY OF PRESENTING ILLNESS:  Shelley Macias 57 y.o. female is here because of BARD 1  This is a very pleasant 57 year old female patient with no significant past medical history referred to high risk breast clinic because of pathogenic BARD 1 mutation.    Interval history She is here for follow-up.  She denies  any new health complaints.  She lost her February 03, 2022, mom had stage IV breast cancer and was in hospice for almost 10 months. She otherwise has been exercising regularly although she gained some weight and she was hoping to lose the weight again. Rest of the pertinent 10 point ROS reviewed and negative  MEDICAL HISTORY:  Past Medical History:  Diagnosis Date   Allergy    Anxiety    Depression    Phreesia 07/18/2019   Esophageal ulcer    Family history of breast cancer    Family history of breast cancer gene mutation in first degree relative    GERD (gastroesophageal reflux disease)    Seasonal allergies    Urinary tract infection     SURGICAL HISTORY: Past Surgical History:  Procedure Laterality Date   [place] N/A    Phreesia 07/18/2019   COLONOSCOPY N/A 10/25/2014   Procedure: COLONOSCOPY;  Surgeon: Rogene Houston, MD;  [facility];  Service: Endoscopy;  Laterality: N/A;  1030 -- cancel order for TCS   ESOPHAGOGASTRODUODENOSCOPY     ESOPHAGOGASTRODUODENOSCOPY N/A 10/25/2014   Procedure: ESOPHAGOGASTRODUODENOSCOPY (EGD);  Surgeon: Rogene Houston, MD;  [facility];  Service: Endoscopy;  Laterality: N/A;    SOCIAL HISTORY: Social History   Socioeconomic History   Marital status: Married    Spouse name: Not on file   Number of children: Not on file   Years of education: Not on file   Highest education level: Not on  file  Occupational History   Not on file  Tobacco Use   Smoking status: Never   Smokeless tobacco: Never  Substance and Sexual Activity   Alcohol use: No   Drug use: No   Sexual activity: Yes    Birth control/protection: Surgical  Other Topics Concern   Not on file  Social History Narrative   Not on file   Social Determinants of Health   Financial Resource Strain: Not on file  Food Insecurity: Not on file  Transportation Needs: Not on file  Physical Activity:  Not on file  Stress: Not on file  Social Connections: Not on file  Intimate Partner Violence: Not on file    FAMILY HISTORY: Family History  Problem Relation Age of Onset   Hearing loss Mother    Dementia Mother    Stroke Mother    Hypertension Mother    Breast cancer Mother 85       BARD1+   Heart disease Father        A- fib   Hyperlipidemia Father    Hypertension Father    Diabetes Father    Cancer Father        Basal cell   Mental illness Sister    Learning disabilities Sister    Cancer Sister        BILE DUCT d, 101   Diabetes Brother    Hypertension Brother    Vision loss Brother    Arthritis Brother    Hypertension Brother    Breast cancer Maternal Aunt        d. 87s   Lung cancer Cousin        Stage IV, never smoker, think secondary to Radon    ALLERGIES:  is allergic to demerol [meperidine], other, and penicillins.  MEDICATIONS:  Current Outpatient Medications  Medication Sig Dispense Refill   buPROPion (WELLBUTRIN XL) 150 MG 24 hr tablet Take 1 tablet (150 mg total) by mouth daily. 90 tablet 1   citalopram (CELEXA) 40 MG tablet Take 1 tablet (40 mg total) by mouth daily. 90 tablet 1   magnesium oxide (MAG-OX) 400 MG tablet Take 400 mg by mouth daily.     Multiple Vitamin (MULTIVITAMIN WITH MINERALS) TABS tablet Take by mouth daily.     Semaglutide-Weight Management 0.25 MG/0.5ML SOAJ Inject 0.25 mg into the skin once a week. 2 mL 0   No current facility-administered medications for this visit.     PHYSICAL EXAMINATION: ECOG PERFORMANCE STATUS: 0 - Asymptomatic  Vitals:   02/25/22 0818  BP: 128/70  Pulse: 60  Resp: 16  Temp: 97.6 F (36.4 C)  SpO2: 98%   Filed Weights   02/25/22 0818  Weight: 161 lb 8 oz (73.3 kg)    Physical Exam Constitutional:      Appearance: Normal appearance.  Chest:     Comments: Bilateral breasts inspected.  No palpable masses or regional adenopathy Abdominal:     General: Bowel sounds are normal.   Musculoskeletal:        General: No swelling or tenderness. Normal range of motion.  Skin:    General: Skin is warm and dry.  Neurological:     General: No focal deficit present.     Mental Status: She is alert.      LABORATORY DATA:  I have reviewed the data as listed Lab Results  Component Value Date   WBC 7.4 07/15/2021   HGB 14.5 07/15/2021   HCT 42.7 07/15/2021   MCV  94 07/15/2021   PLT 258 07/15/2021     Chemistry      Component Value Date/Time   NA 139 07/15/2021 0954   K 4.6 07/15/2021 0954   CL 102 07/15/2021 0954   CO2 25 07/15/2021 0954   BUN 14 07/15/2021 0954   CREATININE 0.90 07/15/2021 0954   CREATININE 1.02 07/13/2020 1548      Component Value Date/Time   CALCIUM 9.5 07/15/2021 0954   ALKPHOS 76 07/15/2021 0954   AST 18 07/15/2021 0954   ALT 16 07/15/2021 0954   BILITOT 0.6 07/15/2021 0954       RADIOGRAPHIC STUDIES: I have personally reviewed the radiological images as listed and agreed with the findings in the report. No results found.  All questions were answered. The patient knows to call the clinic with any problems, questions or concerns. I spent 30 minutes in the care of this patient including H and P, review of records, counseling and coordination of care.     Benay Pike, MD 02/25/2022 8:34 AM

## 2022-03-17 ENCOUNTER — Encounter: Payer: Self-pay | Admitting: Family Medicine

## 2022-03-24 ENCOUNTER — Other Ambulatory Visit: Payer: Self-pay | Admitting: Nurse Practitioner
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
## 2022-03-27 ENCOUNTER — Other Ambulatory Visit (HOSPITAL_COMMUNITY): Payer: Self-pay

## 2022-04-30 ENCOUNTER — Other Ambulatory Visit (HOSPITAL_COMMUNITY): Payer: Self-pay

## 2022-04-30 ENCOUNTER — Other Ambulatory Visit: Payer: Self-pay | Admitting: Nurse Practitioner

## 2022-05-02 DIAGNOSIS — H524 Presbyopia: Secondary | ICD-10-CM | POA: Diagnosis not present

## 2022-05-03 ENCOUNTER — Other Ambulatory Visit (HOSPITAL_COMMUNITY): Payer: Self-pay

## 2022-05-06 ENCOUNTER — Other Ambulatory Visit: Payer: Self-pay

## 2022-05-06 ENCOUNTER — Other Ambulatory Visit: Payer: Self-pay | Admitting: Family Medicine

## 2022-05-06 ENCOUNTER — Other Ambulatory Visit (HOSPITAL_COMMUNITY): Payer: Self-pay

## 2022-05-06 MED ORDER — CITALOPRAM HYDROBROMIDE 40 MG PO TABS
40.0000 mg | ORAL_TABLET | Freq: Every day | ORAL | 3 refills | Status: DC
  Filled 2022-05-06 – 2022-08-14 (×2): qty 90, 90d supply, fill #0
  Filled 2022-08-14: qty 90, 90d supply, fill #1

## 2022-05-06 MED ORDER — BUPROPION HCL ER (XL) 150 MG PO TB24
150.0000 mg | ORAL_TABLET | Freq: Every day | ORAL | 3 refills | Status: DC
Start: 2022-05-06 — End: 2022-10-27
  Filled 2022-05-06: qty 90, 90d supply, fill #0

## 2022-05-08 ENCOUNTER — Other Ambulatory Visit (HOSPITAL_COMMUNITY): Payer: Self-pay

## 2022-05-22 DIAGNOSIS — Z01419 Encounter for gynecological examination (general) (routine) without abnormal findings: Secondary | ICD-10-CM | POA: Diagnosis not present

## 2022-05-22 DIAGNOSIS — Z13820 Encounter for screening for osteoporosis: Secondary | ICD-10-CM | POA: Diagnosis not present

## 2022-05-22 DIAGNOSIS — Z6831 Body mass index (BMI) 31.0-31.9, adult: Secondary | ICD-10-CM | POA: Diagnosis not present

## 2022-06-09 ENCOUNTER — Encounter: Payer: Self-pay | Admitting: Family Medicine

## 2022-07-17 ENCOUNTER — Encounter: Payer: Self-pay | Admitting: Family Medicine

## 2022-07-29 ENCOUNTER — Encounter: Payer: Commercial Managed Care - PPO | Admitting: Family Medicine

## 2022-08-14 ENCOUNTER — Other Ambulatory Visit (HOSPITAL_BASED_OUTPATIENT_CLINIC_OR_DEPARTMENT_OTHER): Payer: Self-pay

## 2022-08-14 ENCOUNTER — Other Ambulatory Visit (HOSPITAL_COMMUNITY): Payer: Self-pay

## 2022-09-25 ENCOUNTER — Encounter: Payer: Commercial Managed Care - PPO | Admitting: Family Medicine

## 2022-09-25 ENCOUNTER — Encounter: Payer: Self-pay | Admitting: Family Medicine

## 2022-10-23 ENCOUNTER — Encounter: Payer: Self-pay | Admitting: Family Medicine

## 2022-10-23 ENCOUNTER — Other Ambulatory Visit: Payer: Self-pay | Admitting: Family Medicine

## 2022-10-23 ENCOUNTER — Ambulatory Visit (INDEPENDENT_AMBULATORY_CARE_PROVIDER_SITE_OTHER): Payer: Commercial Managed Care - PPO | Admitting: Family Medicine

## 2022-10-23 VITALS — BP 106/71 | Temp 97.5°F | Ht 60.0 in | Wt 156.4 lb

## 2022-10-23 DIAGNOSIS — M8580 Other specified disorders of bone density and structure, unspecified site: Secondary | ICD-10-CM

## 2022-10-23 DIAGNOSIS — Z130 Encounter for screening for diseases of the blood and blood-forming organs and certain disorders involving the immune mechanism: Secondary | ICD-10-CM | POA: Diagnosis not present

## 2022-10-23 DIAGNOSIS — Z0000 Encounter for general adult medical examination without abnormal findings: Secondary | ICD-10-CM

## 2022-10-23 DIAGNOSIS — E785 Hyperlipidemia, unspecified: Secondary | ICD-10-CM

## 2022-10-23 NOTE — Patient Instructions (Signed)
Follow up annually. ? ?Take care ? ?Dr. Cook  ?

## 2022-10-24 LAB — CMP14+EGFR
ALT: 15 IU/L (ref 0–32)
AST: 14 IU/L (ref 0–40)
Albumin: 4.5 g/dL (ref 3.8–4.9)
Alkaline Phosphatase: 76 IU/L (ref 44–121)
BUN/Creatinine Ratio: 17 (ref 9–23)
BUN: 15 mg/dL (ref 6–24)
Bilirubin Total: 0.6 mg/dL (ref 0.0–1.2)
CO2: 24 mmol/L (ref 20–29)
Calcium: 9.8 mg/dL (ref 8.7–10.2)
Chloride: 105 mmol/L (ref 96–106)
Creatinine, Ser: 0.89 mg/dL (ref 0.57–1.00)
Globulin, Total: 1.9 g/dL (ref 1.5–4.5)
Glucose: 96 mg/dL (ref 70–99)
Potassium: 5.5 mmol/L — ABNORMAL HIGH (ref 3.5–5.2)
Sodium: 142 mmol/L (ref 134–144)
Total Protein: 6.4 g/dL (ref 6.0–8.5)
eGFR: 75 mL/min/{1.73_m2} (ref >59)

## 2022-10-24 LAB — LIPID PANEL
Chol/HDL Ratio: 3.9 ratio (ref 0.0–4.4)
Cholesterol, Total: 197 mg/dL (ref 100–199)
HDL: 51 mg/dL (ref >39)
LDL Chol Calc (NIH): 124 mg/dL — ABNORMAL HIGH (ref 0–99)
Triglycerides: 126 mg/dL (ref 0–149)
VLDL Cholesterol Cal: 22 mg/dL (ref 5–40)

## 2022-10-24 LAB — CBC
Hematocrit: 45.8 % (ref 34.0–46.6)
Hemoglobin: 14.5 g/dL (ref 11.1–15.9)
MCH: 31.6 pg (ref 26.6–33.0)
MCHC: 31.7 g/dL (ref 31.5–35.7)
MCV: 100 fL — ABNORMAL HIGH (ref 79–97)
Platelets: 246 10*3/uL (ref 150–450)
RBC: 4.59 x10E6/uL (ref 3.77–5.28)
RDW: 11.9 % (ref 11.7–15.4)
WBC: 6.6 10*3/uL (ref 3.4–10.8)

## 2022-10-27 ENCOUNTER — Other Ambulatory Visit (HOSPITAL_COMMUNITY): Payer: Self-pay

## 2022-10-27 MED ORDER — BUPROPION HCL ER (XL) 150 MG PO TB24
150.0000 mg | ORAL_TABLET | Freq: Every day | ORAL | 3 refills | Status: DC
  Filled 2022-10-27: qty 90, 90d supply, fill #0

## 2022-10-27 MED ORDER — CITALOPRAM HYDROBROMIDE 40 MG PO TABS
40.0000 mg | ORAL_TABLET | Freq: Every day | ORAL | 3 refills | Status: DC
  Filled 2022-10-27: qty 90, 90d supply, fill #0
  Filled 2023-03-09: qty 90, 90d supply, fill #1

## 2022-10-27 NOTE — Assessment & Plan Note (Signed)
Preventative health care updated. Doing well. Meds refilled.

## 2022-10-27 NOTE — Progress Notes (Signed)
Subjective:  Patient ID: Shelley Macias, female    DOB: 05/10/1965  Age: 57 y.o. MRN: [id number]  CC: Chief Complaint  Patient presents with   Annual Exam    HPI:  57 year old female presents for an annual exam.  Patient is doing well. Declines shingles vaccine. Not planning on additional COVID vaccines. Up to date on Pap smear, mammogram and colonscopy.  No chest pain or SOB. Feeling well.   Patient Active Problem List   Diagnosis Date Noted   Annual physical exam 07/15/2021   Obesity 06/19/2021   BARD1 gene mutation positive 05/23/2021   Family history of breast cancer 04/11/2021   Osteopenia 12/08/2019   Mixed anxiety and depressive disorder 05/02/2014   Seasonal allergies 05/02/2014    Social Hx   Social History   Socioeconomic History   Marital status: Married    Spouse name: Not on file   Number of children: Not on file   Years of education: Not on file   Highest education level: Not on file  Occupational History   Not on file  Tobacco Use   Smoking status: Never   Smokeless tobacco: Never  Substance and Sexual Activity   Alcohol use: No   Drug use: No   Sexual activity: Yes    Birth control/protection: Surgical  Other Topics Concern   Not on file  Social History Narrative   Not on file   Social Determinants of Health   Financial Resource Strain: Not on file  Food Insecurity: Not on file  Transportation Needs: Not on file  Physical Activity: Not on file  Stress: Not on file  Social Connections: Not on file    Review of Systems Per HPI  Objective:  BP 106/71   Temp (!) 97.5 F (36.4 C)   Ht 5' (1.524 m)   Wt 156 lb 6.4 oz (70.9 kg)   SpO2 98%   BMI 30.54 kg/m      10/23/2022    1:27 PM 02/25/2022    8:18 AM 08/23/2021    1:25 PM  BP/Weight  Systolic BP 106 128 122  Diastolic BP 71 70 81  Wt. (Lbs) 156.4 161.5 150.5  BMI 30.54 kg/m2 31.54 kg/m2 29.39 kg/m2    Physical Exam Vitals and nursing note reviewed.  Constitutional:       General: She is not in acute distress.    Appearance: Normal appearance.  HENT:     Head: Normocephalic and atraumatic.  Eyes:     General:        Right eye: No discharge.        Left eye: No discharge.     Conjunctiva/sclera: Conjunctivae normal.  Cardiovascular:     Rate and Rhythm: Normal rate and regular rhythm.  Pulmonary:     Effort: Pulmonary effort is normal.     Breath sounds: Normal breath sounds. No wheezing, rhonchi or rales.  Neurological:     Mental Status: She is alert.  Psychiatric:        Mood and Affect: Mood normal.        Behavior: Behavior normal.     Lab Results  Component Value Date   WBC 6.6 10/23/2022   HGB 14.5 10/23/2022   HCT 45.8 10/23/2022   PLT 246 10/23/2022   GLUCOSE 96 10/23/2022   CHOL 197 10/23/2022   TRIG 126 10/23/2022   HDL 51 10/23/2022   LDLCALC 124 (H) 10/23/2022   ALT 15 10/23/2022   AST 14  10/23/2022   NA 142 10/23/2022   K 5.5 (H) 10/23/2022   CL 105 10/23/2022   CREATININE 0.89 10/23/2022   BUN 15 10/23/2022   CO2 24 10/23/2022   TSH 1.690 07/15/2021   HGBA1C 5.3 07/15/2021     Assessment & Plan:   Problem List Items Addressed This Visit       Other   Annual physical exam - Primary    Preventative health care updated. Doing well. Meds refilled.        Meds ordered this encounter  Medications   buPROPion (WELLBUTRIN XL) 150 MG 24 hr tablet    Sig: Take 1 tablet (150 mg total) by mouth daily.    Dispense:  90 tablet    Refill:  3   citalopram (CELEXA) 40 MG tablet    Sig: Take 1 tablet (40 mg total) by mouth daily.    Dispense:  90 tablet    Refill:  3    Follow-up:  Annually.  Everlene Other DO [facility]

## 2022-10-28 ENCOUNTER — Other Ambulatory Visit: Payer: Self-pay

## 2022-12-02 ENCOUNTER — Other Ambulatory Visit (HOSPITAL_COMMUNITY): Payer: Self-pay | Admitting: Hematology and Oncology

## 2022-12-02 DIAGNOSIS — Z1231 Encounter for screening mammogram for malignant neoplasm of breast: Secondary | ICD-10-CM

## 2022-12-08 ENCOUNTER — Ambulatory Visit (HOSPITAL_COMMUNITY)
Admission: RE | Admit: 2022-12-08 | Discharge: 2022-12-08 | Disposition: A | Discharge status: Home / Self Care | Payer: Commercial Managed Care - PPO | Source: Ambulatory Visit | Attending: Hematology and Oncology | Admitting: Hematology and Oncology

## 2022-12-08 DIAGNOSIS — Z1231 Encounter for screening mammogram for malignant neoplasm of breast: Secondary | ICD-10-CM | POA: Insufficient documentation

## 2022-12-10 ENCOUNTER — Encounter: Payer: Self-pay | Admitting: Hematology and Oncology

## 2022-12-17 ENCOUNTER — Other Ambulatory Visit: Payer: Self-pay | Admitting: *Deleted

## 2022-12-17 DIAGNOSIS — Z1501 Genetic susceptibility to malignant neoplasm of breast: Secondary | ICD-10-CM

## 2022-12-17 DIAGNOSIS — Z8481 Family history of carrier of genetic disease: Secondary | ICD-10-CM

## 2023-02-26 ENCOUNTER — Inpatient Hospital Stay: Payer: Commercial Managed Care - PPO | Attending: Hematology and Oncology | Admitting: Hematology and Oncology

## 2023-03-09 ENCOUNTER — Other Ambulatory Visit: Payer: Self-pay

## 2023-04-13 ENCOUNTER — Encounter: Payer: Self-pay | Admitting: Family Medicine

## 2023-04-13 ENCOUNTER — Other Ambulatory Visit: Payer: Self-pay | Admitting: Family Medicine

## 2023-04-13 MED ORDER — DOXYCYCLINE HYCLATE 100 MG PO TABS: 100.0000 mg | ORAL_TABLET | Freq: Two times a day (BID) | ORAL | 0 refills | Status: DC

## 2023-06-09 ENCOUNTER — Ambulatory Visit
Admission: RE | Admit: 2023-06-09 | Discharge: 2023-06-09 | Disposition: A | Discharge status: Home / Self Care | Payer: Commercial Managed Care - PPO | Source: Ambulatory Visit | Attending: Hematology and Oncology

## 2023-06-09 DIAGNOSIS — Z8481 Family history of carrier of genetic disease: Secondary | ICD-10-CM

## 2023-06-09 DIAGNOSIS — Z1501 Genetic susceptibility to malignant neoplasm of breast: Secondary | ICD-10-CM

## 2023-06-09 DIAGNOSIS — Z1239 Encounter for other screening for malignant neoplasm of breast: Secondary | ICD-10-CM | POA: Diagnosis not present

## 2023-06-09 DIAGNOSIS — Z803 Family history of malignant neoplasm of breast: Secondary | ICD-10-CM | POA: Diagnosis not present

## 2023-06-09 MED ORDER — GADOPICLENOL 0.5 MMOL/ML IV SOLN
7.0000 mL | Freq: Once | INTRAVENOUS | Status: AC | PRN
  Administered 2023-06-09: 7 mL via INTRAVENOUS

## 2023-06-18 DIAGNOSIS — M8580 Other specified disorders of bone density and structure, unspecified site: Secondary | ICD-10-CM | POA: Diagnosis not present

## 2023-06-18 DIAGNOSIS — R898 Other abnormal findings in specimens from other organs, systems and tissues: Secondary | ICD-10-CM | POA: Diagnosis not present

## 2023-06-18 DIAGNOSIS — Z6830 Body mass index (BMI) 30.0-30.9, adult: Secondary | ICD-10-CM | POA: Diagnosis not present

## 2023-06-18 DIAGNOSIS — Z124 Encounter for screening for malignant neoplasm of cervix: Secondary | ICD-10-CM | POA: Diagnosis not present

## 2023-06-18 DIAGNOSIS — Z01419 Encounter for gynecological examination (general) (routine) without abnormal findings: Secondary | ICD-10-CM | POA: Diagnosis not present

## 2023-06-18 DIAGNOSIS — Z1151 Encounter for screening for human papillomavirus (HPV): Secondary | ICD-10-CM | POA: Diagnosis not present

## 2023-09-08 ENCOUNTER — Encounter: Payer: Self-pay | Admitting: Family Medicine

## 2023-10-19 ENCOUNTER — Other Ambulatory Visit: Payer: Self-pay | Admitting: Family Medicine

## 2023-10-19 DIAGNOSIS — M8580 Other specified disorders of bone density and structure, unspecified site: Secondary | ICD-10-CM | POA: Diagnosis not present

## 2023-10-19 DIAGNOSIS — Z130 Encounter for screening for diseases of the blood and blood-forming organs and certain disorders involving the immune mechanism: Secondary | ICD-10-CM | POA: Diagnosis not present

## 2023-10-19 DIAGNOSIS — E785 Hyperlipidemia, unspecified: Secondary | ICD-10-CM

## 2023-10-19 DIAGNOSIS — Z0000 Encounter for general adult medical examination without abnormal findings: Secondary | ICD-10-CM

## 2023-10-20 ENCOUNTER — Ambulatory Visit: Payer: Self-pay | Admitting: Family Medicine

## 2023-10-20 LAB — CMP14+EGFR
ALT: 13 IU/L (ref 0–32)
AST: 14 IU/L (ref 0–40)
Albumin: 4.3 g/dL (ref 3.8–4.9)
Alkaline Phosphatase: 84 IU/L (ref 44–121)
BUN/Creatinine Ratio: 19 (ref 9–23)
BUN: 16 mg/dL (ref 6–24)
Bilirubin Total: 0.5 mg/dL (ref 0.0–1.2)
CO2: 22 mmol/L (ref 20–29)
Calcium: 9.4 mg/dL (ref 8.7–10.2)
Chloride: 103 mmol/L (ref 96–106)
Creatinine, Ser: 0.84 mg/dL (ref 0.57–1.00)
Globulin, Total: 1.9 g/dL (ref 1.5–4.5)
Glucose: 89 mg/dL (ref 70–99)
Potassium: 4.5 mmol/L (ref 3.5–5.2)
Sodium: 139 mmol/L (ref 134–144)
Total Protein: 6.2 g/dL (ref 6.0–8.5)
eGFR: 80 mL/min/1.73 (ref >59)

## 2023-10-20 LAB — LIPID PANEL
Chol/HDL Ratio: 3.7 ratio (ref 0.0–4.4)
Cholesterol, Total: 191 mg/dL (ref 100–199)
HDL: 51 mg/dL (ref >39)
LDL Chol Calc (NIH): 123 mg/dL — ABNORMAL HIGH (ref 0–99)
Triglycerides: 91 mg/dL (ref 0–149)
VLDL Cholesterol Cal: 17 mg/dL (ref 5–40)

## 2023-10-20 LAB — CBC
Hematocrit: 43 % (ref 34.0–46.6)
Hemoglobin: 14.4 g/dL (ref 11.1–15.9)
MCH: 32.3 pg (ref 26.6–33.0)
MCHC: 33.5 g/dL (ref 31.5–35.7)
MCV: 96 fL (ref 79–97)
Platelets: 250 x10E3/uL (ref 150–450)
RBC: 4.46 x10E6/uL (ref 3.77–5.28)
RDW: 11.9 % (ref 11.7–15.4)
WBC: 5.8 x10E3/uL (ref 3.4–10.8)

## 2023-10-23 ENCOUNTER — Encounter: Payer: Self-pay | Admitting: Family Medicine

## 2023-10-23 ENCOUNTER — Ambulatory Visit (INDEPENDENT_AMBULATORY_CARE_PROVIDER_SITE_OTHER): Admitting: Family Medicine

## 2023-10-23 VITALS — BP 108/71 | HR 67 | Temp 97.3°F | Ht 60.0 in | Wt 155.0 lb

## 2023-10-23 DIAGNOSIS — Z0000 Encounter for general adult medical examination without abnormal findings: Secondary | ICD-10-CM | POA: Diagnosis not present

## 2023-10-23 NOTE — Patient Instructions (Signed)
Follow up annually. ? ?Take care ? ?Dr. Kivon Aprea  ?

## 2023-10-26 NOTE — Progress Notes (Signed)
 Subjective:  Patient ID: Shelley Macias, female    DOB: 26-Apr-1965  Age: 58 y.o. MRN: [id number]  CC:   Chief Complaint  Patient presents with   Annual Exam    [facility] pap,mammo, and breast MRI yearly dr [facility] for the BARD gene     HPI:  58 year old female presents for an annual physical exam.  Overall doing well. Pap smear up to date (done in April of 2025 by Gyn). Has BARD1 gene mutation. Follows with Oncology. Gets breast MRI and mammogram.  Due for flu vaccine, pneumococcal vaccine, shingles vaccine. Will discuss today.  Recent labs reviewed. Notable for LDL of 123. Otherwise normal. Low ASCVD risk score. The 10-year ASCVD risk score (Arnett DK, et al., 2019) is: 2.2%   Values used to calculate the score:     Age: 34 years     Clincally relevant sex: Female     Is Non-Hispanic African American: No     Diabetic: No     Tobacco smoker: No     Systolic Blood Pressure: 108 mmHg     Is BP treated: No     HDL Cholesterol: 51 mg/dL     Total Cholesterol: 191 mg/dL   Patient Active Problem List   Diagnosis Date Noted   Annual physical exam 07/15/2021   Obesity 06/19/2021   BARD1 gene mutation positive 05/23/2021   Family history of breast cancer 04/11/2021   Osteopenia 12/08/2019   Mixed anxiety and depressive disorder 05/02/2014   Seasonal allergies 05/02/2014    Social Hx   Social History   Socioeconomic History   Marital status: Married    Spouse name: Not on file   Number of children: Not on file   Years of education: Not on file   Highest education level: Bachelor's degree (e.g., BA, AB, BS)  Occupational History   Not on file  Tobacco Use   Smoking status: Never   Smokeless tobacco: Never  Substance and Sexual Activity   Alcohol use: No   Drug use: No   Sexual activity: Yes    Birth control/protection: Surgical  Other Topics Concern   Not on file  Social History Narrative   Not on file   Social Drivers of  Health   Financial Resource Strain: Low Risk  (10/19/2023)   Overall Financial Resource Strain (CARDIA)    Difficulty of Paying Living Expenses: Not hard at all  Food Insecurity: No Food Insecurity (10/19/2023)   Hunger Vital Sign    Worried About Running Out of Food in the Last Year: Never true    Ran Out of Food in the Last Year: Never true  Transportation Needs: No Transportation Needs (10/19/2023)   PRAPARE - Administrator, Civil Service (Medical): No    Lack of Transportation (Non-Medical): No  Physical Activity: Sufficiently Active (10/19/2023)   Exercise Vital Sign    Days of Exercise per Week: 4 days    Minutes of Exercise per Session: 60 min  Stress: No Stress Concern Present (10/19/2023)   [employer] of Occupational Health - Occupational Stress Questionnaire    Feeling of Stress: Only a little  Social Connections: Socially Integrated (10/19/2023)   Social Connection and Isolation Panel    Frequency of Communication with Friends and Family: More than three times a week    Frequency of Social Gatherings with Friends and Family: More than three times a week    Attends Religious Services:  More than 4 times per year    Active Member of Clubs or Organizations: Yes    Attends Banker Meetings: More than 4 times per year    Marital Status: Married    Review of Systems Per HPI  Objective:  BP 108/71   Pulse 67   Temp (!) 97.3 F (36.3 C)   Ht 5' (1.524 m)   Wt 155 lb (70.3 kg)   SpO2 97%   BMI 30.27 kg/m      10/23/2023   10:06 AM 10/23/2022    1:27 PM 02/25/2022    8:18 AM  BP/Weight  Systolic BP 108 106 128  Diastolic BP 71 71 70  Wt. (Lbs) 155 156.4 161.5  BMI 30.27 kg/m2 30.54 kg/m2 31.54 kg/m2    Physical Exam Vitals and nursing note reviewed.  Constitutional:      General: She is not in acute distress.    Appearance: Normal appearance.  HENT:     Head: Normocephalic and atraumatic.  Eyes:     General:        Right eye: No  discharge.        Left eye: No discharge.     Conjunctiva/sclera: Conjunctivae normal.  Cardiovascular:     Rate and Rhythm: Normal rate and regular rhythm.  Pulmonary:     Effort: Pulmonary effort is normal.     Breath sounds: Normal breath sounds. No wheezing, rhonchi or rales.  Neurological:     Mental Status: She is alert.  Psychiatric:        Mood and Affect: Mood normal.        Behavior: Behavior normal.     Lab Results  Component Value Date   WBC 5.8 10/19/2023   HGB 14.4 10/19/2023   HCT 43.0 10/19/2023   PLT 250 10/19/2023   GLUCOSE 89 10/19/2023   CHOL 191 10/19/2023   TRIG 91 10/19/2023   HDL 51 10/19/2023   LDLCALC 123 (H) 10/19/2023   ALT 13 10/19/2023   AST 14 10/19/2023   NA 139 10/19/2023   K 4.5 10/19/2023   CL 103 10/19/2023   CREATININE 0.84 10/19/2023   BUN 16 10/19/2023   CO2 22 10/19/2023   TSH 1.690 07/15/2021   HGBA1C 5.3 07/15/2021     Assessment & Plan:  Annual physical exam Assessment & Plan: Overall doing well. Labs reviewed.  Discussed preventative care/health maintenance items. Follow up annually.    Follow-up:  Annually  Hoy Fallert DO [facility]

## 2023-10-26 NOTE — Assessment & Plan Note (Signed)
 Overall doing well. Labs reviewed.  Discussed preventative care/health maintenance items. Follow up annually.

## 2023-12-02 ENCOUNTER — Other Ambulatory Visit: Payer: Self-pay | Admitting: Family Medicine

## 2023-12-03 ENCOUNTER — Other Ambulatory Visit (HOSPITAL_COMMUNITY): Payer: Self-pay

## 2023-12-03 MED ORDER — CITALOPRAM HYDROBROMIDE 40 MG PO TABS
40.0000 mg | ORAL_TABLET | Freq: Every day | ORAL | 3 refills | Status: AC
  Filled 2023-12-03: qty 90, 90d supply, fill #0
  Filled 2024-03-26: qty 90, 90d supply, fill #1

## 2023-12-08 ENCOUNTER — Other Ambulatory Visit (HOSPITAL_COMMUNITY): Payer: Self-pay | Admitting: Family Medicine

## 2023-12-08 DIAGNOSIS — Z1231 Encounter for screening mammogram for malignant neoplasm of breast: Secondary | ICD-10-CM

## 2023-12-09 ENCOUNTER — Encounter (INDEPENDENT_AMBULATORY_CARE_PROVIDER_SITE_OTHER): Payer: Self-pay | Admitting: Gastroenterology

## 2023-12-14 ENCOUNTER — Encounter: Payer: Self-pay | Admitting: Family Medicine

## 2023-12-14 ENCOUNTER — Other Ambulatory Visit: Payer: Self-pay | Admitting: Family Medicine

## 2023-12-14 MED ORDER — TIRZEPATIDE-WEIGHT MANAGEMENT 2.5 MG/0.5ML ~~LOC~~ SOLN: 2.5000 mg | SUBCUTANEOUS | 0 refills | Status: DC

## 2023-12-16 ENCOUNTER — Other Ambulatory Visit (HOSPITAL_COMMUNITY): Payer: Self-pay | Admitting: Family Medicine

## 2023-12-16 ENCOUNTER — Encounter (HOSPITAL_COMMUNITY): Payer: Self-pay

## 2023-12-16 ENCOUNTER — Ambulatory Visit (HOSPITAL_COMMUNITY)
Admission: RE | Admit: 2023-12-16 | Discharge: 2023-12-16 | Disposition: A | Discharge status: Home / Self Care | Source: Ambulatory Visit | Attending: Family Medicine | Admitting: Family Medicine

## 2023-12-16 DIAGNOSIS — Z1231 Encounter for screening mammogram for malignant neoplasm of breast: Secondary | ICD-10-CM

## 2024-01-04 ENCOUNTER — Other Ambulatory Visit: Payer: Self-pay | Admitting: Family Medicine

## 2024-01-04 ENCOUNTER — Encounter: Payer: Self-pay | Admitting: Family Medicine

## 2024-01-04 MED ORDER — TIRZEPATIDE-WEIGHT MANAGEMENT 2.5 MG/0.5ML ~~LOC~~ SOLN: 2.5000 mg | SUBCUTANEOUS | 1 refills | Status: AC

## 2024-03-26 ENCOUNTER — Other Ambulatory Visit (HOSPITAL_COMMUNITY): Payer: Self-pay
